# Patient Record
Sex: Female | Born: 1970 | Race: White | Hispanic: No | Marital: Married | Smoking: Current every day smoker
Health system: Southern US, Community
[De-identification: ages and names within clinical notes are randomized; demographics above are authoritative.]

## PROBLEM LIST (undated history)

## (undated) DIAGNOSIS — E039 Hypothyroidism, unspecified: Secondary | ICD-10-CM

## (undated) DIAGNOSIS — E8809 Other disorders of plasma-protein metabolism, not elsewhere classified: Secondary | ICD-10-CM

## (undated) DIAGNOSIS — T8859XA Other complications of anesthesia, initial encounter: Secondary | ICD-10-CM

## (undated) DIAGNOSIS — T4145XA Adverse effect of unspecified anesthetic, initial encounter: Secondary | ICD-10-CM

## (undated) DIAGNOSIS — Z8489 Family history of other specified conditions: Secondary | ICD-10-CM

## (undated) HISTORY — PX: TONSILLECTOMY: SUR1361

## (undated) HISTORY — PX: BREAST ENHANCEMENT SURGERY: SHX7

## (undated) HISTORY — DX: Hypothyroidism, unspecified: E03.9

---

## 2008-09-06 ENCOUNTER — Emergency Department: Payer: Self-pay | Admitting: Emergency Medicine

## 2009-08-27 ENCOUNTER — Emergency Department: Payer: Self-pay | Admitting: Emergency Medicine

## 2010-05-06 ENCOUNTER — Ambulatory Visit: Payer: Self-pay | Admitting: Obstetrics and Gynecology

## 2010-05-13 ENCOUNTER — Ambulatory Visit: Payer: Self-pay | Admitting: Obstetrics and Gynecology

## 2010-06-07 ENCOUNTER — Ambulatory Visit: Payer: Self-pay | Admitting: Emergency Medicine

## 2010-11-28 ENCOUNTER — Emergency Department: Payer: Self-pay | Admitting: Emergency Medicine

## 2015-11-10 ENCOUNTER — Encounter: Payer: Self-pay | Admitting: Emergency Medicine

## 2015-11-10 ENCOUNTER — Emergency Department
Admission: EM | Admit: 2015-11-10 | Discharge: 2015-11-10 | Disposition: A | Discharge status: Home / Self Care | Payer: 59 | Attending: Emergency Medicine | Admitting: Emergency Medicine

## 2015-11-10 DIAGNOSIS — J069 Acute upper respiratory infection, unspecified: Secondary | ICD-10-CM

## 2015-11-10 DIAGNOSIS — F17210 Nicotine dependence, cigarettes, uncomplicated: Secondary | ICD-10-CM | POA: Insufficient documentation

## 2015-11-10 DIAGNOSIS — M436 Torticollis: Secondary | ICD-10-CM | POA: Diagnosis not present

## 2015-11-10 DIAGNOSIS — R05 Cough: Secondary | ICD-10-CM | POA: Diagnosis present

## 2015-11-10 MED ORDER — METHOCARBAMOL 750 MG PO TABS: 750.0000 mg | ORAL_TABLET | Freq: Four times a day (QID) | ORAL | Status: DC

## 2015-11-10 MED ORDER — FEXOFENADINE-PSEUDOEPHED ER 60-120 MG PO TB12: 1.0000 | ORAL_TABLET | Freq: Two times a day (BID) | ORAL | Status: DC

## 2015-11-10 MED ORDER — NAPROXEN 500 MG PO TABS: 500.0000 mg | ORAL_TABLET | Freq: Two times a day (BID) | ORAL | Status: DC

## 2015-11-10 NOTE — Discharge Instructions (Signed)
Acute Torticollis Torticollis is a condition in which the muscles of the neck tighten (contract) abnormally, causing the neck to twist and the head to move into an unnatural position. Torticollis that develops suddenly is called acute torticollis. If torticollis becomes chronic and is left untreated, the face and neck can become deformed. CAUSES This condition may be caused by:  Sleeping in an awkward position (common).  Extending or twisting the neck muscles beyond their normal position.  Infection. In some cases, the cause may not be known. SYMPTOMS Symptoms of this condition include:  An unnatural position of the head.  Neck pain.  A limited ability to move the neck.  Twisting of the neck to one side. DIAGNOSIS This condition is diagnosed with a physical exam. You may also have imaging tests, such as an X-ray, CT scan, or MRI. TREATMENT Treatment for this condition involves trying to relax the neck muscles. It may include:  Medicines or shots.  Physical therapy.  Surgery. This may be done in severe cases. HOME CARE INSTRUCTIONS  Take medicines only as directed by your health care provider.  Do stretching exercises and massage your neck as directed by your health care provider.  Keep all follow-up visits as directed by your health care provider. This is important. SEEK MEDICAL CARE IF:  You develop a fever. SEEK IMMEDIATE MEDICAL CARE IF:  You develop difficulty breathing.  You develop noisy breathing (stridor).  You start drooling.  You have trouble swallowing or have pain with swallowing.  You develop numbness or weakness in your hands or feet.  You have changes in your speech, understanding, or vision.  Your pain gets worse.   This information is not intended to replace advice given to you by your health care provider. Make sure you discuss any questions you have with your health care provider.   Document Released: 06/16/2000 Document Revised:  11/03/2014 Document Reviewed: 06/15/2014 Elsevier Interactive Patient Education 2016 [employer]  Upper Respiratory Infection, Adult Most upper respiratory infections (URIs) are a viral infection of the air passages leading to the lungs. A URI affects the nose, throat, and upper air passages. The most common type of URI is nasopharyngitis and is typically referred to as "the common cold." URIs run their course and usually go away on their own. Most of the time, a URI does not require medical attention, but sometimes a bacterial infection in the upper airways can follow a viral infection. This is called a secondary infection. Sinus and middle ear infections are common types of secondary upper respiratory infections. Bacterial pneumonia can also complicate a URI. A URI can worsen asthma and chronic obstructive pulmonary disease (COPD). Sometimes, these complications can require emergency medical care and may be life threatening.  CAUSES Almost all URIs are caused by viruses. A virus is a type of germ and can spread from one person to another.  RISKS FACTORS You may be at risk for a URI if:   You smoke.   You have chronic heart or lung disease.  You have a weakened defense (immune) system.   You are very young or very old.   You have nasal allergies or asthma.  You work in crowded or poorly ventilated areas.  You work in health care facilities or schools. SIGNS AND SYMPTOMS  Symptoms typically develop 2-3 days after you come in contact with a cold virus. Most viral URIs last 7-10 days. However, viral URIs from the influenza virus (flu virus) can last 14-18 days and are  typically more severe. Symptoms may include:   Runny or stuffy (congested) nose.   Sneezing.   Cough.   Sore throat.   Headache.   Fatigue.   Fever.   Loss of appetite.   Pain in your forehead, behind your eyes, and over your cheekbones (sinus pain).  Muscle aches.  DIAGNOSIS  Your health  care provider may diagnose a URI by:  Physical exam.  Tests to check that your symptoms are not due to another condition such as:  Strep throat.  Sinusitis.  Pneumonia.  Asthma. TREATMENT  A URI goes away on its own with time. It cannot be cured with medicines, but medicines may be prescribed or recommended to relieve symptoms. Medicines may help:  Reduce your fever.  Reduce your cough.  Relieve nasal congestion. HOME CARE INSTRUCTIONS   Take medicines only as directed by your health care provider.   Gargle warm saltwater or take cough drops to comfort your throat as directed by your health care provider.  Use a warm mist humidifier or inhale steam from a shower to increase air moisture. This may make it easier to breathe.  Drink enough fluid to keep your urine clear or pale yellow.   Eat soups and other clear broths and maintain good nutrition.   Rest as needed.   Return to work when your temperature has returned to normal or as your health care provider advises. You may need to stay home longer to avoid infecting others. You can also use a face mask and careful hand washing to prevent spread of the virus.  Increase the usage of your inhaler if you have asthma.   Do not use any tobacco products, including cigarettes, chewing tobacco, or electronic cigarettes. If you need help quitting, ask your health care provider. PREVENTION  The best way to protect yourself from getting a cold is to practice good hygiene.   Avoid oral or hand contact with people with cold symptoms.   Wash your hands often if contact occurs.  There is no clear evidence that vitamin C, vitamin E, echinacea, or exercise reduces the chance of developing a cold. However, it is always recommended to get plenty of rest, exercise, and practice good nutrition.  SEEK MEDICAL CARE IF:   You are getting worse rather than better.   Your symptoms are not controlled by medicine.   You have  chills.  You have worsening shortness of breath.  You have brown or red mucus.  You have yellow or brown nasal discharge.  You have pain in your face, especially when you bend forward.  You have a fever.  You have swollen neck glands.  You have pain while swallowing.  You have white areas in the back of your throat. SEEK IMMEDIATE MEDICAL CARE IF:   You have severe or persistent:  Headache.  Ear pain.  Sinus pain.  Chest pain.  You have chronic lung disease and any of the following:  Wheezing.  Prolonged cough.  Coughing up blood.  A change in your usual mucus.  You have a stiff neck.  You have changes in your:  Vision.  Hearing.  Thinking.  Mood. MAKE SURE YOU:   Understand these instructions.  Will watch your condition.  Will get help right away if you are not doing well or get worse.   This information is not intended to replace advice given to you by your health care provider. Make sure you discuss any questions you have with your health care  provider.   Document Released: 12/13/2000 Document Revised: 11/03/2014 Document Reviewed: 09/24/2013 Elsevier Interactive Patient Education [employer]

## 2015-11-10 NOTE — ED Notes (Signed)
Patient ambulatory to triage with steady gait, without difficulty or distress noted; pt reports last couple weeks having cold symptoms; c/o neck and shoulder pain with head movement; productive cough, sinus congestion, HA, no fever

## 2015-11-10 NOTE — ED Provider Notes (Signed)
[facility] Emergency Department Provider Note  ____________________________________________  Time seen: Approximately 7:21 AM  I have reviewed the triage vital signs and the nursing notes.   HISTORY  Chief Complaint Cough; Torticollis; and Nasal Congestion   HPI Crystal Reynolds is a 45 y.o. female, NAD, presents to the emergency department today with two week history of cough and nasal congestion. She states she has had a cough with sputum but does not know what color it is. She woke up two days ago with neck pain and stiffness. She denies a fall or strain of her neck. She denies fever at any point in her illness. She has only taken ibuprofen for the pain. She does not like to use allergy medicine because it makes her feel drowsy.  History reviewed. No pertinent past medical history.  There are no active problems to display for this patient.   Past Surgical History  Procedure Laterality Date  . Cesarean section    . Tonsillectomy      Current Outpatient Rx  Name  Route  Sig  Dispense  Refill  . fexofenadine-pseudoephedrine (ALLEGRA-D) 60-120 MG 12 hr tablet   Oral   Take 1 tablet by mouth 2 (two) times daily.   20 tablet   0   . methocarbamol (ROBAXIN-750) 750 MG tablet   Oral   Take 1 tablet (750 mg total) by mouth 4 (four) times daily.   20 tablet   0   . naproxen (NAPROSYN) 500 MG tablet   Oral   Take 1 tablet (500 mg total) by mouth 2 (two) times daily with a meal.   20 tablet   0     Allergies Review of patient's allergies indicates no known allergies.  No family history on file.  Social History Social History  Substance Use Topics  . Smoking status: Current Every Day Smoker -- 1.00 packs/day    Types: Cigarettes  . Smokeless tobacco: None  . Alcohol Use: No    Review of Systems Constitutional: No fever/chills Eyes: No visual changes. ENT: Positive sore throat. Cardiovascular: Denies chest pain. Respiratory: Denies  shortness of breath. Positive cough. Gastrointestinal: Positive nausea. No abdominal pain. No vomiting.  No diarrhea.  No constipation. Genitourinary: Negative for dysuria. Musculoskeletal:Positive for neck pain with movement. Negative for back pain. Skin: Negative for rash. Neurological: Negative for headaches, focal weakness or numbness.  ____________________________________________   PHYSICAL EXAM:  VITAL SIGNS: ED Triage Vitals  Enc Vitals Group     BP 11/10/15 0655 117/55 mmHg     Pulse Rate 11/10/15 0655 71     Resp 11/10/15 0655 20     Temp 11/10/15 0655 97.8 F (36.6 C)     Temp Source 11/10/15 0655 Oral     SpO2 11/10/15 0655 100 %     Weight 11/10/15 0655 142 lb (64.411 kg)     Height 11/10/15 0655  (1.702 m)     Head Cir --      Peak Flow --      Pain Score 11/10/15 0655 1     Pain Loc --      Pain Edu? --      Excl. in GC? --    Constitutional: Alert and oriented. Well appearing and in no acute distress. Eyes: Conjunctivae are normal. EOMI. Head: Atraumatic. Nose: No congestion/rhinnorhea. Mouth/Throat: Mucous membranes are moist.  Oropharynx non-erythematous. Neck: No stridor.  TTP along the trapezius. Hematological/Lymphatic/Immunilogical: No cervical lymphadenopathy. Cardiovascular: Normal rate, regular  rhythm. Grossly normal heart sounds.  Good peripheral circulation. Respiratory: Normal respiratory effort.  No retractions. Lungs CTAB. Gastrointestinal: Soft and nontender. No distention. No CVA tenderness.  Musculoskeletal: Full ROM of neck with mild pain. No nuchal rigidity. No lower extremity tenderness nor edema.  No joint effusions. Neurologic:  Normal speech and language. No gross focal neurologic deficits are appreciated. No gait instability.  Skin:  Skin is warm, dry and intact. No rash noted. Psychiatric: Mood and affect are normal. Speech and behavior are normal.  PROCEDURES  Procedure(s) performed: None  Critical Care performed:  No  ____________________________________________   INITIAL IMPRESSION / ASSESSMENT AND PLAN / ED COURSE  Pertinent labs & imaging results that were available during my care of the patient were reviewed by me and considered in my medical decision making (see chart for details).  Upper respiratory infection with torticollis. Discharged with naprosyn, robaxin, and allegra. Ad[facility] if symptoms worsen or continue. ____________________________________________   FINAL CLINICAL IMPRESSION(S) / ED DIAGNOSES  Final diagnoses:  Torticollis  Upper respiratory infection      NEW MEDICATIONS STARTED DURING THIS VISIT:  New Prescriptions   FEXOFENADINE-PSEUDOEPHEDRINE (ALLEGRA-D) 60-120 MG 12 HR TABLET    Take 1 tablet by mouth 2 (two) times daily.   METHOCARBAMOL (ROBAXIN-750) 750 MG TABLET    Take 1 tablet (750 mg total) by mouth 4 (four) times daily.   NAPROXEN (NAPROSYN) 500 MG TABLET    Take 1 tablet (500 mg total) by mouth 2 (two) times daily with a meal.     Note:  This document was prepared using Dragon voice recognition software and may include unintentional dictation errors.    Joni Reiningonald K Dwayn Moravek, PA-C 11/10/15 [id number]  Minna AntisKevin Paduchowski, MD 11/10/15 [phone]

## 2016-05-31 ENCOUNTER — Encounter: Payer: Self-pay | Admitting: Primary Care

## 2016-05-31 ENCOUNTER — Ambulatory Visit (INDEPENDENT_AMBULATORY_CARE_PROVIDER_SITE_OTHER)
Admission: RE | Admit: 2016-05-31 | Discharge: 2016-05-31 | Disposition: A | Discharge status: Home / Self Care | Payer: Commercial Managed Care - HMO | Source: Ambulatory Visit | Attending: Primary Care | Admitting: Primary Care

## 2016-05-31 ENCOUNTER — Ambulatory Visit (INDEPENDENT_AMBULATORY_CARE_PROVIDER_SITE_OTHER): Payer: Commercial Managed Care - HMO | Admitting: Primary Care

## 2016-05-31 VITALS — BP 114/74 | HR 76 | Temp 98.2°F | Ht 67.0 in | Wt 155.1 lb

## 2016-05-31 DIAGNOSIS — E039 Hypothyroidism, unspecified: Secondary | ICD-10-CM | POA: Diagnosis not present

## 2016-05-31 DIAGNOSIS — M25561 Pain in right knee: Secondary | ICD-10-CM

## 2016-05-31 DIAGNOSIS — F319 Bipolar disorder, unspecified: Secondary | ICD-10-CM

## 2016-05-31 DIAGNOSIS — G8929 Other chronic pain: Secondary | ICD-10-CM

## 2016-05-31 DIAGNOSIS — Z720 Tobacco use: Secondary | ICD-10-CM

## 2016-05-31 LAB — TSH: TSH: 1.64 u[IU]/mL (ref 0.35–4.50)

## 2016-05-31 LAB — BASIC METABOLIC PANEL
BUN: 16 mg/dL (ref 6–23)
CHLORIDE: 103 meq/L (ref 96–112)
CO2: 29 meq/L (ref 19–32)
CREATININE: 0.84 mg/dL (ref 0.40–1.20)
Calcium: 9.5 mg/dL (ref 8.4–10.5)
GFR: 77.62 mL/min (ref >60.00)
Glucose, Bld: 88 mg/dL (ref 70–99)
Potassium: 4.2 mEq/L (ref 3.5–5.1)
Sodium: 137 mEq/L (ref 135–145)

## 2016-05-31 NOTE — Assessment & Plan Note (Signed)
Since fall in April 2017. Obvious effusion today. Will check xray given weakness and pain. Will also have her see Sports Med for further treatment given chronicity. Would benefit from PT, may require MRI in the future.

## 2016-05-31 NOTE — Assessment & Plan Note (Signed)
No recent TSH since 2012. No medication use since 2012. Will recheck today as symptoms of weight gain, anxiety, fatigue could be contributed. TSH pending.

## 2016-05-31 NOTE — Assessment & Plan Note (Signed)
Symptoms of daily anxiety, some depression. Will check TSH today. Does not wish to be on medications, respect that. Will continue to monitor symptoms and work on tobacco cessation.

## 2016-05-31 NOTE — Progress Notes (Signed)
Subjective:    Patient ID: Crystal Reynolds, female    DOB: 1971/04/03, 45 y.o.   MRN: [id number]  HPI  Crystal Reynolds is a 45 year old female who presents today to establish care and discuss the problems mentioned below. Will obtain old records.  1) Tobacco Abuse: History of tobacco abuse since early 30's. She is smoking 1 PPD of cigarettes now. She has a family history of lung cancer in her mother. Over the last several months she's noticed shortness of breath and chest tightness, constant cough. She is ready to stop smoking as she is aware that it is harmful for her health, but will need assistance. She is very sensitive to medication side effects. She was previously managed on Chantix four years ago and had side effects of insomnia and terrible nightmares. She's not tried the nicotine patches or gum OTC. She uses smoking to help reduce anxiety and stress.  2) Thyroid Disorder: History of hypothyroidism since childhood. Was previously managed on Synthroid for years until 2000 after her 4th pregnancy when her thyroid corrected itself. Her last TSH was in 2012. She's experienced weight gain, anxiety, and is more sensitive to colder weather.  3) Bipolar Disorder: Diagnosed in early 20's. She was managed on medication up until her mid 7530's as she didn't like the way medication made her feel. She's used exercise and cigarettes for treatment up until April 2017 when she became unable to exercise. She continues to experience anxiety and panic attacks. She's also been frustrated with her weight gain since an accident in April 2017. She was previously managed on Wellbutrin, Prozac, Lithium. She denies SI/HI.  4) Knee Pain: Feel off of a ladder at work in April 2017. She fractured her left wrist, displaced her left shoulder, and bruised her right knee. Her right knee has been bothering her intermittently since the accident. She worse a brace to her right knee for a while. She's continued to experience swelling  to her right knee and hsa had several needle aspirations with return of swelling almost immediately. She's also noticed weakness and crepitus. Her symptoms prevent her from working out.    Review of Systems  Constitutional: Positive for fatigue. Negative for fever and unexpected weight change.  HENT: Negative for trouble swallowing.   Respiratory: Positive for cough, chest tightness and shortness of breath.   Cardiovascular: Negative for chest pain.  Musculoskeletal: Positive for arthralgias and joint swelling.  Skin: Negative for color change.  Neurological: Negative for dizziness and headaches.  Psychiatric/Behavioral: Negative for suicidal ideas. The patient is nervous/anxious.        Past Medical History:  Diagnosis Date  . Hypothyroid      Social History   Social History  . Marital status: Married    Spouse name: N/A  . Number of children: N/A  . Years of education: N/A   Occupational History  . Not on file.   Social History Main Topics  . Smoking status: Current Every Day Smoker    Packs/day: 1.00    Types: Cigarettes  . Smokeless tobacco: Not on file  . Alcohol use No  . Drug use: Unknown  . Sexual activity: Not on file   Other Topics Concern  . Not on file   Social History Narrative   Married.   4 children.   Works as a Futures traderhomemaker.       Past Surgical History:  Procedure Laterality Date  . CESAREAN SECTION    . TONSILLECTOMY  Family History  Problem Relation Age of Onset  . Lung cancer Mother   . Hypertension Father   . Hyperlipidemia Father     No Known Allergies  No current outpatient prescriptions on file prior to visit.   No current facility-administered medications on file prior to visit.     BP 114/74   Pulse 76   Temp 98.2 F (36.8 C) (Oral)   Ht 5\' 7"  (1.702 m)   Wt 155 lb 1.9 oz (70.4 kg)   SpO2 98%   BMI 24.30 kg/m    Objective:   Physical Exam  Constitutional: She appears well-nourished.  Neck: Neck supple.    Cardiovascular: Normal rate and regular rhythm.   Pulmonary/Chest: Effort normal and breath sounds normal. She has no wheezes.  Musculoskeletal:  Mild-moderate effusion to right knee, superficial. No erythema.  Skin: Skin is warm and dry.  Psychiatric: She has a normal mood and affect.          Assessment & Plan:

## 2016-05-31 NOTE — Progress Notes (Signed)
Pre visit review using our clinic review tool, if applicable. No additional management support is needed unless otherwise documented below in the visit note. 

## 2016-05-31 NOTE — Patient Instructions (Signed)
Complete lab work and xray prior to leaving today. I will notify you of your results once received.   Start the transdermal nicotine patch. Apply the patch to a non hairy site of the body and leave on for 24 hours. Rotate sites to prevent skin irritation.  Refrain from smoking when using nicotine patches.  It was a pleasure to meet you today! Please don't hesitate to call me with any questions. Welcome to [facility]!

## 2016-05-31 NOTE — Assessment & Plan Note (Signed)
Smoker intermittently for 10 years. Ready to quit. Discussed options. Side effects with Chantix and Wellbutrin in the past. Will have her start with patches and sugar free gum. Will consider therapy if no improvement. Strongly encouraged her to quit, especially given FH of lung cancer.

## 2016-06-01 ENCOUNTER — Other Ambulatory Visit: Payer: Self-pay | Admitting: Adult Health

## 2016-06-01 DIAGNOSIS — Z1231 Encounter for screening mammogram for malignant neoplasm of breast: Secondary | ICD-10-CM

## 2016-06-01 DIAGNOSIS — Z9882 Breast implant status: Secondary | ICD-10-CM

## 2016-06-02 ENCOUNTER — Other Ambulatory Visit: Payer: Self-pay | Admitting: Primary Care

## 2016-06-02 DIAGNOSIS — F411 Generalized anxiety disorder: Secondary | ICD-10-CM

## 2016-06-02 DIAGNOSIS — Z720 Tobacco use: Secondary | ICD-10-CM

## 2016-06-21 ENCOUNTER — Encounter: Payer: Self-pay | Admitting: Family Medicine

## 2016-06-21 ENCOUNTER — Ambulatory Visit (INDEPENDENT_AMBULATORY_CARE_PROVIDER_SITE_OTHER): Payer: Commercial Managed Care - HMO | Admitting: Family Medicine

## 2016-06-21 VITALS — BP 110/64 | HR 64 | Temp 98.4°F | Ht 67.0 in | Wt 157.5 lb

## 2016-06-21 DIAGNOSIS — M25561 Pain in right knee: Secondary | ICD-10-CM

## 2016-06-21 DIAGNOSIS — M25461 Effusion, right knee: Secondary | ICD-10-CM

## 2016-06-21 DIAGNOSIS — M2351 Chronic instability of knee, right knee: Secondary | ICD-10-CM | POA: Diagnosis not present

## 2016-06-21 DIAGNOSIS — M7041 Prepatellar bursitis, right knee: Secondary | ICD-10-CM

## 2016-06-21 MED ORDER — DIAZEPAM 5 MG PO TABS: ORAL_TABLET | ORAL | 0 refills | Status: DC

## 2016-06-21 NOTE — Patient Instructions (Signed)

## 2016-06-21 NOTE — Progress Notes (Signed)
Pre visit review using our clinic review tool, if applicable. No additional management support is needed unless otherwise documented below in the visit note. 

## 2016-06-21 NOTE — Progress Notes (Signed)
Dr. Karleen HampshireSpencer T. Isley Weisheit, MD, CAQ Sports Medicine Primary Care and Sports Medicine [address] [place], [address] Phone: [phone] Fax: [phone]  06/21/2016  Patient: Crystal LucyJudith M Cliburn, MRN: [id number], DOB: 1970/07/16, 45 y.o.  Primary Physician:  Morrie Sheldonlark,Katherine Kendal, NP   Chief Complaint  Patient presents with  . Knee Pain    Right-Had fall about 7 to 8 months ago   Subjective:   Crystal LucyJudith M Teale is a 45 y.o. very pleasant female patient who presents with the following:  The patient describes a history where up until 8 months ago she was extremely active, hiking, working out all the time, and kickboxing.  At that time she had an event and she fell and hurt her knee.  Since that time she has per her report to me been unable to resume her baseline activity level.  She is unable to go to the gym, she cannot kick box, she cannot do any the things that she wants to for her normally physical self where she usually works out more than 1 hour a day.  The pain will wax and wane.  She also has a large prepatellar bursitis.  This will fluctuate in size over time.  Sometimes this hurts, but other times it doesn't hurt at all.  Her knee globally feels unstable, and feels that it is going to give out.  She has multiple sensations where her knee is going to symptomatically give way and where she feels unsteady.  She has taken multiple over-the-counter medications including anti-inflammatories, Tylenol, and she is a well versed physical person and is done strengthening for her core, hips, and quadriceps without much success and still maintains instability in the knee.  Past Medical History, Surgical History, Social History, Family History, Problem List, Medications, and Allergies have been reviewed and updated if relevant.  Patient Active Problem List   Diagnosis Date Noted  . Chronic pain of right knee 05/31/2016  . Hypothyroidism 05/31/2016  . Bipolar disorder (HCC) 05/31/2016    . Tobacco abuse 05/31/2016    Past Medical History:  Diagnosis Date  . Hypothyroid     Past Surgical History:  Procedure Laterality Date  . CESAREAN SECTION    . TONSILLECTOMY      Social History   Social History  . Marital status: Married    Spouse name: N/A  . Number of children: N/A  . Years of education: N/A   Occupational History  . Not on file.   Social History Main Topics  . Smoking status: Current Every Day Smoker    Packs/day: 1.00    Types: Cigarettes  . Smokeless tobacco: Never Used  . Alcohol use No  . Drug use: Unknown  . Sexual activity: Not on file   Other Topics Concern  . Not on file   Social History Narrative   Married.   4 children.   Works as a Futures traderhomemaker.       Family History  Problem Relation Age of Onset  . Lung cancer Mother   . Hypertension Father   . Hyperlipidemia Father     No Known Allergies  Medication list reviewed and updated in full in [facility].  GEN: No fevers, chills. Nontoxic. Primarily MSK c/o today. MSK: Detailed in the HPI GI: tolerating PO intake without difficulty Neuro: No numbness, parasthesias, or tingling associated. Otherwise the pertinent positives of the ROS are noted above.   Objective:   BP 110/64   Pulse 64  Temp 98.4 F (36.9 C) (Oral)   Ht 5\' 7"  (1.702 m)   Wt 157 lb 8 oz (71.4 kg)   BMI 24.67 kg/m    GEN: WDWN, NAD, Non-toxic, Alert & Oriented x 3 HEENT: Atraumatic, Normocephalic.  Ears and Nose: No external deformity. EXTR: No clubbing/cyanosis/edema NEURO: Normal gait.  PSYCH: Normally interactive. Conversant. Not depressed or anxious appearing.  Calm demeanor.    Right knee: Moderate prepatellar bursitis.  This is not hot, warm, and easily compressible. Mild effusion. There is a large swelling in the posterior aspect of the patient's knee, consistent with popliteal cyst.  There is also some tenderness on the medial joint line greater than the lateral joint line.  MCL and  LCL are intact.  There is some increased give on Lachman on the right compared to left, but PCL does appear to be intact.  Bony anatomy is nontender.  Tibial plateau as well as proximal fibular nontender.  Radiology: Dg Knee Ap/lat W/sunrise Right  Result Date: 05/31/2016 CLINICAL DATA:  Chronic right knee pain since injury April 2017. Initial encounter. EXAM: RIGHT KNEE 3 VIEWS COMPARISON:  None. FINDINGS: No evidence of fracture, dislocation, or joint effusion. No degenerative joint narrowing. Nonspecific tiny ossific density in the posterior joint line on the lateral view. Infrapatellar soft tissue swelling. IMPRESSION: 1. Anterior soft tissue thickening which could reflect bursitis. 2. No acute osseous finding. Electronically Signed   By: Marnee SpringJonathon  Watts M.D.   On: 05/31/2016 15:01    Assessment and Plan:   Right knee pain, unspecified chronicity - Plan: MR KNEE RIGHT WO CONTRAST  Recurrent right knee instability - Plan: MR KNEE RIGHT WO CONTRAST  Effusion of right knee - Plan: MR KNEE RIGHT WO CONTRAST  Prepatellar bursitis of right knee  given the history of 8 months of instability and poor outcome for the patient, I would recommend structural integrity  Evaluation to ensure that the patient's ACL remains intact with MRI of the knee.  Continued functional instability is concerning and a very active and strong athletic 45 year old patient who previously was otherwise healthy and had to knee problems.  Baker cyst is prominent.  Multiple other internal derangement possible, further evaluation recommended.  It came to my attention that the initial may have occurred in the workplace, and the patient may pursue this with her employer.   Follow-up: No Follow-up on file.  Meds ordered this encounter  Medications  . diazepam (VALIUM) 5 MG tablet    Sig: 1 tab 30 minutes before MRI    Dispense:  1 tablet    Refill:  0   There are no discontinued medications. Orders Placed This Encounter    Procedures  . MR KNEE RIGHT WO CONTRAST    Signed,  Anari Evitt T. Taci Sterling, MD   Allergies as of 06/21/2016   No Known Allergies     Medication List       Accurate as of 06/21/16 11:59 PM. Always use your most recent med list.          diazepam 5 MG tablet Commonly known as:  VALIUM 1 tab 30 minutes before MRI

## 2016-07-06 ENCOUNTER — Ambulatory Visit
Admission: RE | Admit: 2016-07-06 | Discharge: 2016-07-06 | Disposition: A | Discharge status: Home / Self Care | Payer: Commercial Managed Care - HMO | Source: Ambulatory Visit | Attending: Adult Health | Admitting: Adult Health

## 2016-07-06 DIAGNOSIS — Z9882 Breast implant status: Secondary | ICD-10-CM

## 2016-07-06 DIAGNOSIS — Z1231 Encounter for screening mammogram for malignant neoplasm of breast: Secondary | ICD-10-CM

## 2016-07-12 ENCOUNTER — Ambulatory Visit (INDEPENDENT_AMBULATORY_CARE_PROVIDER_SITE_OTHER): Payer: 59 | Admitting: Psychology

## 2016-07-12 DIAGNOSIS — F3131 Bipolar disorder, current episode depressed, mild: Secondary | ICD-10-CM | POA: Diagnosis not present

## 2016-07-24 ENCOUNTER — Ambulatory Visit (INDEPENDENT_AMBULATORY_CARE_PROVIDER_SITE_OTHER): Payer: 59 | Admitting: Psychology

## 2016-07-24 DIAGNOSIS — F17200 Nicotine dependence, unspecified, uncomplicated: Secondary | ICD-10-CM

## 2016-07-24 DIAGNOSIS — F3131 Bipolar disorder, current episode depressed, mild: Secondary | ICD-10-CM | POA: Diagnosis not present

## 2016-07-31 ENCOUNTER — Ambulatory Visit (INDEPENDENT_AMBULATORY_CARE_PROVIDER_SITE_OTHER): Payer: 59 | Admitting: Psychology

## 2016-07-31 DIAGNOSIS — F17200 Nicotine dependence, unspecified, uncomplicated: Secondary | ICD-10-CM | POA: Diagnosis not present

## 2016-07-31 DIAGNOSIS — F3131 Bipolar disorder, current episode depressed, mild: Secondary | ICD-10-CM

## 2016-08-07 ENCOUNTER — Ambulatory Visit (INDEPENDENT_AMBULATORY_CARE_PROVIDER_SITE_OTHER): Payer: 59 | Admitting: Psychology

## 2016-08-07 DIAGNOSIS — F17200 Nicotine dependence, unspecified, uncomplicated: Secondary | ICD-10-CM

## 2016-08-07 DIAGNOSIS — F3131 Bipolar disorder, current episode depressed, mild: Secondary | ICD-10-CM

## 2016-08-14 ENCOUNTER — Ambulatory Visit (INDEPENDENT_AMBULATORY_CARE_PROVIDER_SITE_OTHER): Payer: 59 | Admitting: Psychology

## 2016-08-14 DIAGNOSIS — F17200 Nicotine dependence, unspecified, uncomplicated: Secondary | ICD-10-CM

## 2016-08-14 DIAGNOSIS — F3131 Bipolar disorder, current episode depressed, mild: Secondary | ICD-10-CM

## 2016-08-21 ENCOUNTER — Ambulatory Visit: Payer: 59 | Admitting: Psychology

## 2016-08-28 ENCOUNTER — Ambulatory Visit: Payer: Self-pay | Admitting: Psychology

## 2016-09-04 ENCOUNTER — Ambulatory Visit: Payer: Self-pay | Admitting: Psychology

## 2016-09-11 ENCOUNTER — Ambulatory Visit: Payer: 59 | Admitting: Psychology

## 2017-04-30 IMAGING — MG 2D DIGITAL SCREENING BILATERAL MAMMOGRAM WITH IMPLANTS, CAD AND
8 of 17 series · 8 of 33 positions shown · non-contrast
Comparison: None

CLINICAL DATA: Screening. Baseline examination.

EXAM:
2D DIGITAL SCREENING BILATERAL MAMMOGRAM WITH IMPLANTS, CAD AND
ADJUNCT TOMO
The patient has retroglandular implants. Standard and implant
displaced views were performed.

[L MLO]
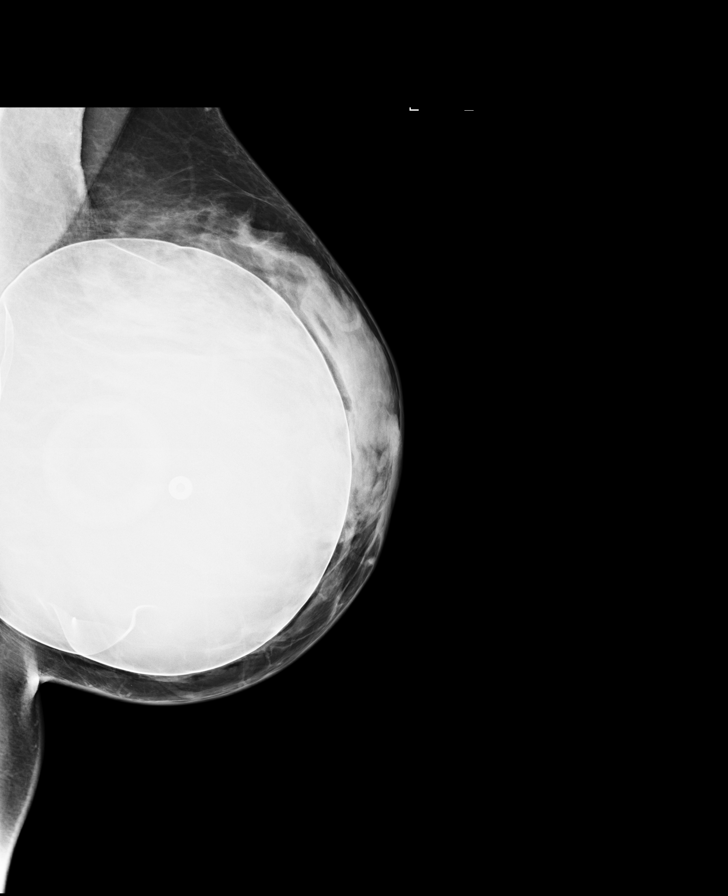

[R MLO (1 of 2)]
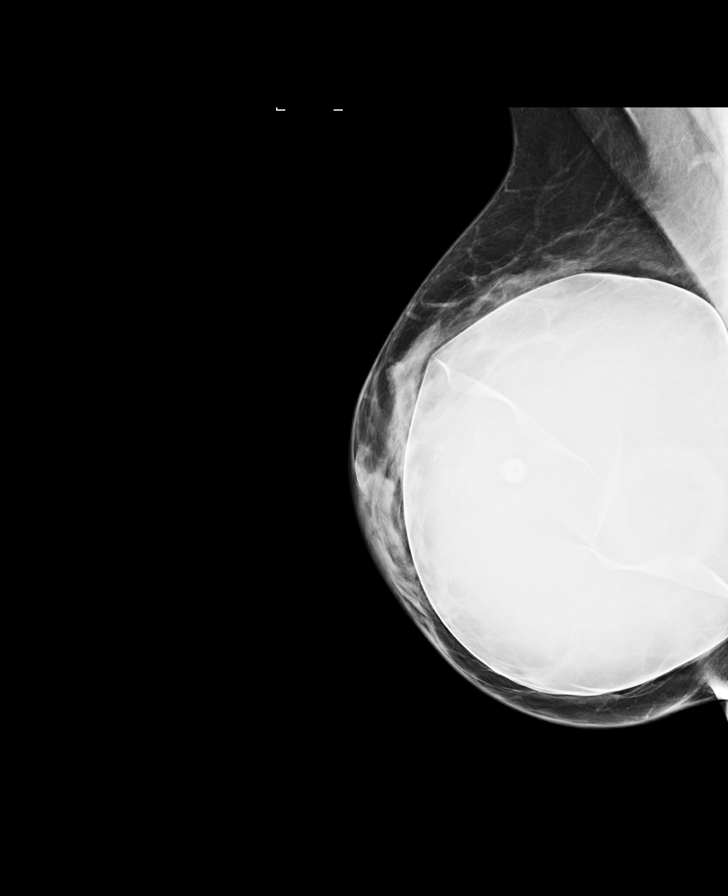

[R CC (1 of 2)]
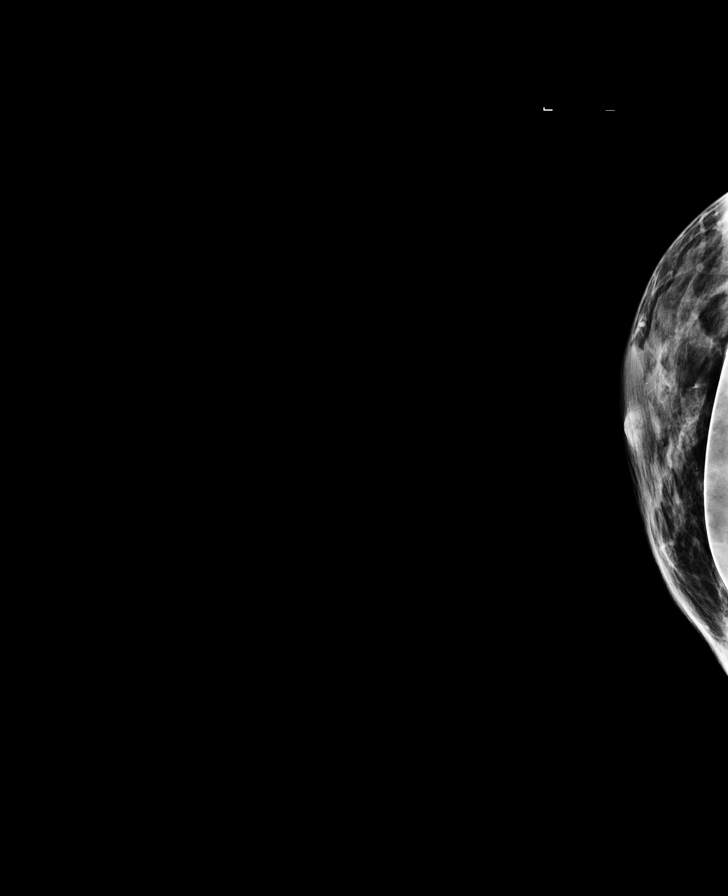

[R CC (2 of 2)]
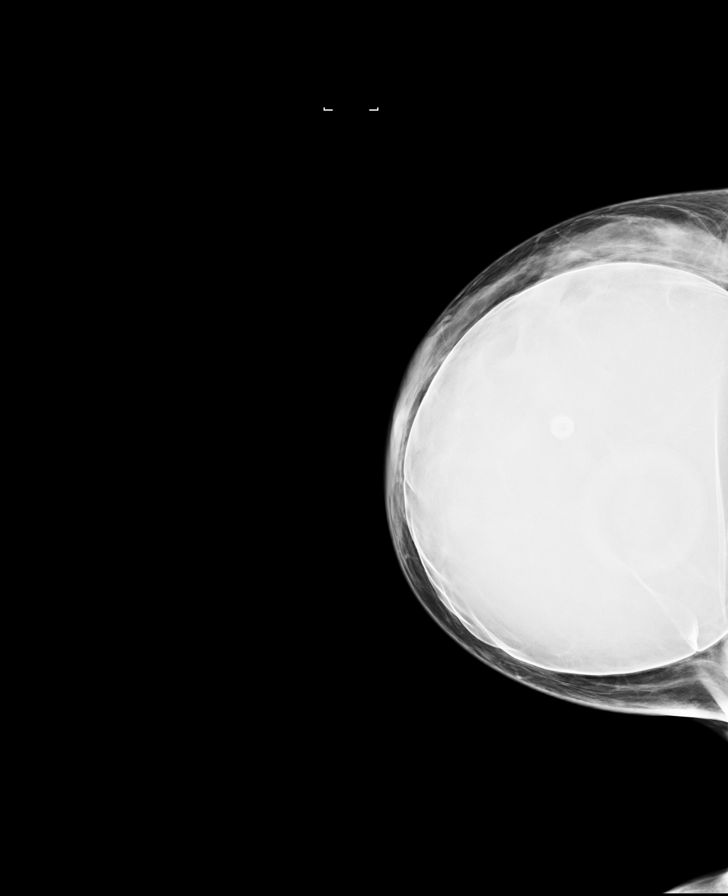

[L CC (1 of 2)]
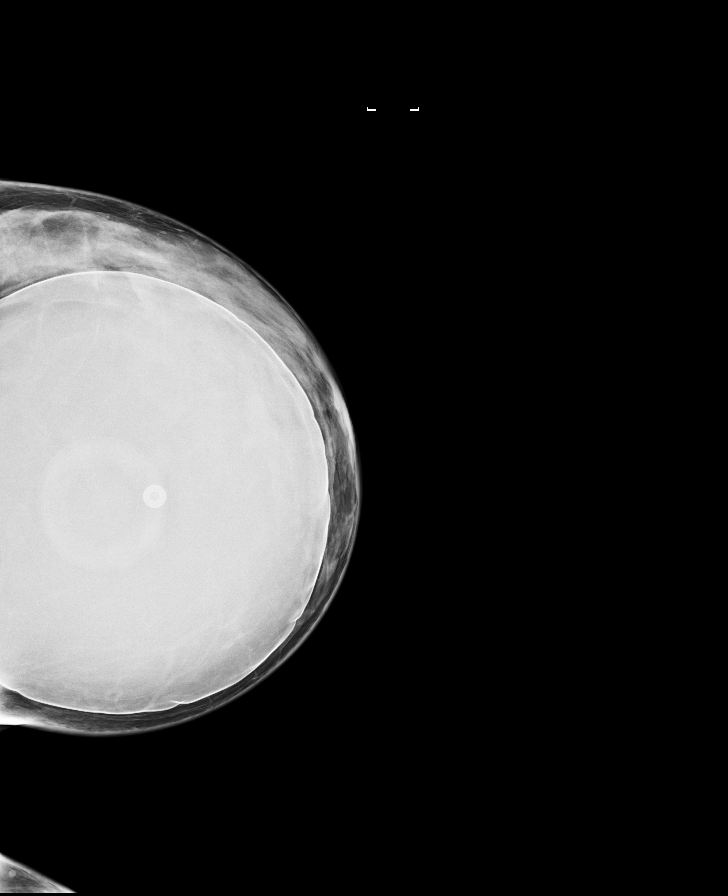

[R CC synth-2D]
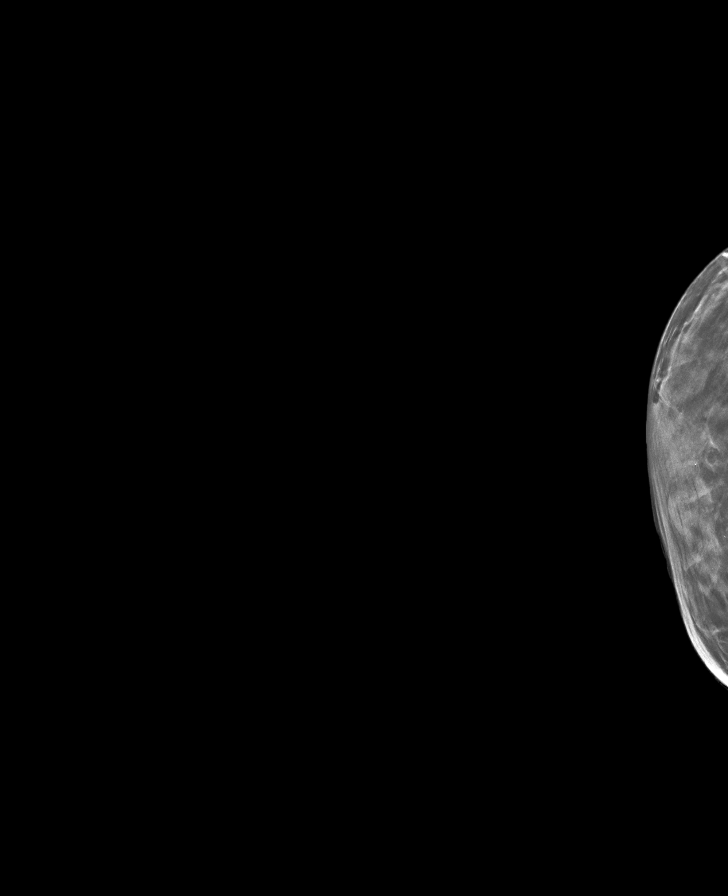

[R MLO (2 of 2)]
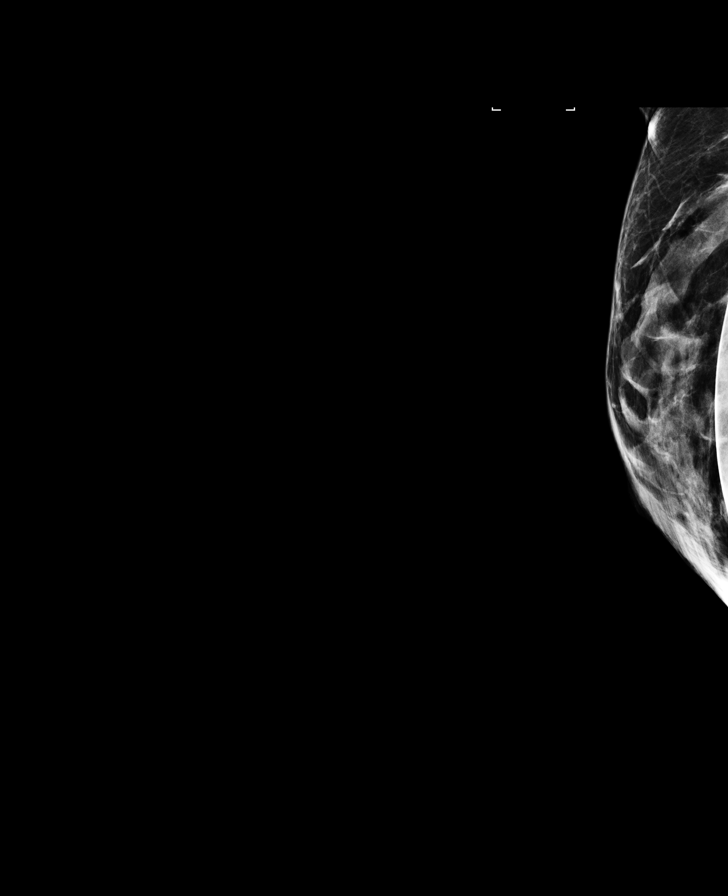

[L CC (2 of 2)]
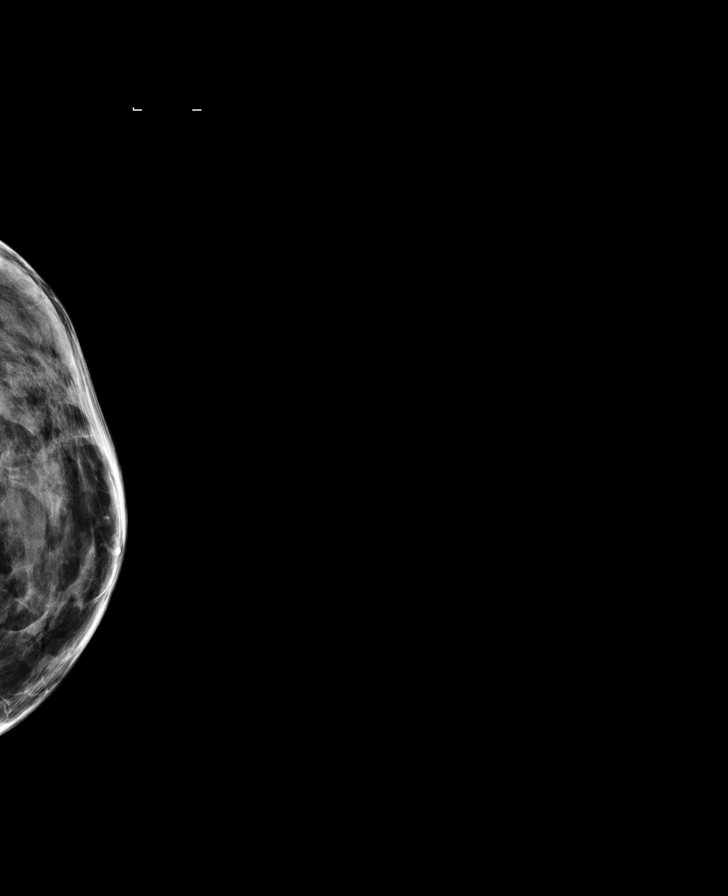

[8 of 33 positions shown; findings below may reference images not displayed]

ACR Breast Density Category c: The breast tissue is heterogeneously
dense, which may obscure small masses.
FINDINGS: There are no findings suspicious for malignancy. Images were
processed with CAD.
IMPRESSION: No mammographic evidence of malignancy. A result letter of this
screening mammogram will be mailed directly to the patient.

RECOMMENDATION:
Screening mammogram in one year. (Code:6D-2-EGR)

BI-RADS CATEGORY  1:  Negative.

## 2017-11-07 ENCOUNTER — Other Ambulatory Visit: Payer: Self-pay

## 2017-11-07 ENCOUNTER — Emergency Department
Admission: EM | Admit: 2017-11-07 | Discharge: 2017-11-07 | Disposition: A | Discharge status: Home / Self Care | Payer: 59 | Attending: Emergency Medicine | Admitting: Emergency Medicine

## 2017-11-07 ENCOUNTER — Encounter: Payer: Self-pay | Admitting: Emergency Medicine

## 2017-11-07 DIAGNOSIS — M7139 Other bursal cyst, multiple sites: Secondary | ICD-10-CM

## 2017-11-07 DIAGNOSIS — R2241 Localized swelling, mass and lump, right lower limb: Secondary | ICD-10-CM | POA: Diagnosis present

## 2017-11-07 DIAGNOSIS — E039 Hypothyroidism, unspecified: Secondary | ICD-10-CM | POA: Diagnosis not present

## 2017-11-07 DIAGNOSIS — M6749 Ganglion, multiple sites: Secondary | ICD-10-CM

## 2017-11-07 DIAGNOSIS — F17210 Nicotine dependence, cigarettes, uncomplicated: Secondary | ICD-10-CM | POA: Insufficient documentation

## 2017-11-07 DIAGNOSIS — M67461 Ganglion, right knee: Secondary | ICD-10-CM | POA: Insufficient documentation

## 2017-11-07 DIAGNOSIS — M6789 Other specified disorders of synovium and tendon, multiple sites: Secondary | ICD-10-CM

## 2017-11-07 MED ORDER — LIDOCAINE HCL (PF) 1 % IJ SOLN
5.0000 mL | Freq: Once | INTRAMUSCULAR | Status: AC
  Administered 2017-11-07: 5 mL via INTRADERMAL
  Filled 2017-11-07: qty 5

## 2017-11-07 MED ORDER — BUPIVACAINE HCL (PF) 0.5 % IJ SOLN
2.0000 mL | Freq: Once | INTRAMUSCULAR | Status: AC
  Administered 2017-11-07: 2 mL
  Filled 2017-11-07: qty 30

## 2017-11-07 MED ORDER — TRIAMCINOLONE ACETONIDE 40 MG/ML IJ SUSP
20.0000 mg | Freq: Once | INTRAMUSCULAR | Status: AC
  Administered 2017-11-07: 20 mg via INTRADERMAL
  Filled 2017-11-07: qty 0.5

## 2017-11-07 NOTE — ED Provider Notes (Signed)
[facility] Emergency Department Provider Note  ____________________________________________  Time seen: Approximately 2:34 PM  I have reviewed the triage vital signs and the nursing notes.   HISTORY  Chief Complaint Cyst   HPI Crystal Reynolds is a 47 y.o. female who presents to the emergency department for evaluation and treatment of a cyst to the right knee that developed about a year ago. Area continues to get larger and is preventing her from being able to perform her job duties as expected. She requests to have it drained. She denies pain.  Past Medical History:  Diagnosis Date  . Hypothyroid     Patient Active Problem List   Diagnosis Date Noted  . Chronic pain of right knee 05/31/2016  . Hypothyroidism 05/31/2016  . Bipolar disorder (HCC) 05/31/2016  . Tobacco abuse 05/31/2016    Past Surgical History:  Procedure Laterality Date  . CESAREAN SECTION    . TONSILLECTOMY      Prior to Admission medications   Medication Sig Start Date End Date Taking? Authorizing Provider  diazepam (VALIUM) 5 MG tablet 1 tab 30 minutes before MRI 06/21/16   Copland, Karleen Hampshire, MD    Allergies Patient has no known allergies.  Family History  Problem Relation Age of Onset  . Lung cancer Mother   . Hypertension Father   . Hyperlipidemia Father     Social History Social History   Tobacco Use  . Smoking status: Current Every Day Smoker    Packs/day: 1.00    Types: Cigarettes  . Smokeless tobacco: Never Used  Substance Use Topics  . Alcohol use: No  . Drug use: Never    Review of Systems  Constitutional: Negative for fever. Respiratory: Negative for cough or shortness of breath.  Musculoskeletal: Negative for myalgias Skin: Positive for lesion to the right knee Neurological: Negative for numbness or paresthesias. ____________________________________________   PHYSICAL EXAM:  VITAL SIGNS: ED Triage Vitals  Enc Vitals Group     BP 11/07/17  1241 115/67     Pulse Rate 11/07/17 1241 60     Resp 11/07/17 1241 14     Temp 11/07/17 1241 98.1 F (36.7 C)     Temp Source 11/07/17 1241 Oral     SpO2 11/07/17 1241 97 %     Weight 11/07/17 1242 128 lb (58.1 kg)     Height 11/07/17 1242  (1.702 m)     Head Circumference --      Peak Flow --      Pain Score 11/07/17 1306 0     Pain Loc --      Pain Edu? --      Excl. in GC? --      Constitutional: Well appearing. Eyes: Conjunctivae are clear without discharge or drainage. Nose: No rhinorrhea noted. Mouth/Throat: Airway is patent.  Neck: No stridor. Unrestricted range of motion observed. Lymphatic: n/a Cardiovascular: Capillary refill is <3 seconds.  Respiratory: Respirations are even and unlabored.. Musculoskeletal: Unrestricted range of motion observed. Neurologic: Awake, alert, and oriented x 4.  Skin: 3 cm cystic, fluctuant, mobile lesion over the lateral aspect of the knee  ____________________________________________   LABS (all labs ordered are listed, but only abnormal results are displayed)  Labs Reviewed  BODY FLUID CULTURE   ____________________________________________  EKG  Not indicated ____________________________________________  RADIOLOGY  Not indicated ____________________________________________   PROCEDURES  .Marland KitchenIncision and Drainage Date/Time: 11/07/2017 4:04 PM Performed by: Chinita Pester, FNP Authorized by: Chinita Pester, FNP  Consent:    Consent obtained:  Verbal   Consent given by:  Patient   Risks discussed:  Incomplete drainage and infection Location:    Type:  Ganglion cyst   Location:  Lower extremity   Lower extremity location:  Knee   Knee location:  R knee Pre-procedure details:    Skin preparation:  Betadine Anesthesia (see MAR for exact dosages):    Anesthesia method:  Local infiltration   Local anesthetic:  Lidocaine 1% w/o epi Procedure type:    Complexity:  Simple Procedure details:    Needle  aspiration: yes     Needle size:  18 G   Techniques: 20 mg of Kenalog and 2 mL's of bupivacaine 0.05% injected post drainage.   Drainage characteristics: straw.   Drainage amount:  Moderate   Packing materials:  None   ____________________________________________   INITIAL IMPRESSION / ASSESSMENT AND PLAN / ED COURSE  Crystal Reynolds is a 47 y.o. female who presents to the emergency department to request drainage of a cyst on her right knee.  Cyst was drained under sterile protocol and fluid was sent to lab for analysis.  Patient was advised to follow-up with her primary care provider for symptoms of concern or return to the emergency department if she is unable to schedule appointment. Medications  triamcinolone acetonide (KENALOG-40) injection 20 mg (20 mg Intradermal Given 11/07/17 1543)  bupivacaine (MARCAINE) 0.5 % injection 2 mL (2 mLs Infiltration Given 11/07/17 1543)  lidocaine (PF) (XYLOCAINE) 1 % injection 5 mL (5 mLs Intradermal Given 11/07/17 1543)       Pertinent labs & imaging results that were available during my care of the patient were reviewed by me and considered in my medical decision making (see chart for details). ____________________________________________   FINAL CLINICAL IMPRESSION(S) / ED DIAGNOSES  Final diagnoses:  Ganglion and cyst of synovium, tendon and bursa    ED Discharge Orders    None       Note:  This document was prepared using Dragon voice recognition software and may include unintentional dictation errors.    Chinita Pester, FNP 11/07/17 1607    Emily Filbert, MD 11/08/17 1114

## 2017-11-07 NOTE — ED Notes (Signed)
Discharge information by this RN entered in error at 1336.

## 2017-11-07 NOTE — ED Notes (Signed)
Pt sitting at bedside, no complaints.

## 2017-11-07 NOTE — ED Triage Notes (Signed)
Pt comes into the ED via POV c/o cyst or growth on the right knee.  Patient states it has been growing for a while and she doesn't know if it need to be drained or just removed.  Patient in NAD at this time and is not limited in mobility due to the growth.

## 2017-11-11 LAB — BODY FLUID CULTURE: CULTURE: NO GROWTH

## 2018-01-23 ENCOUNTER — Encounter: Payer: Self-pay | Admitting: Family Medicine

## 2018-01-23 ENCOUNTER — Ambulatory Visit: Payer: 59 | Admitting: Family Medicine

## 2018-01-23 VITALS — BP 100/60 | HR 62 | Temp 98.3°F | Ht 67.0 in | Wt 134.5 lb

## 2018-01-23 DIAGNOSIS — M7051 Other bursitis of knee, right knee: Secondary | ICD-10-CM | POA: Diagnosis not present

## 2018-01-23 DIAGNOSIS — M7041 Prepatellar bursitis, right knee: Secondary | ICD-10-CM

## 2018-01-23 DIAGNOSIS — F319 Bipolar disorder, unspecified: Secondary | ICD-10-CM | POA: Diagnosis not present

## 2018-01-23 MED ORDER — METHYLPREDNISOLONE ACETATE 40 MG/ML IJ SUSP
20.0000 mg | Freq: Once | INTRAMUSCULAR | Status: AC
  Administered 2018-01-23: 20 mg via INTRA_ARTICULAR

## 2018-01-23 MED ORDER — DOXYCYCLINE HYCLATE 100 MG PO TABS: 100.0000 mg | ORAL_TABLET | Freq: Two times a day (BID) | ORAL | 0 refills | Status: AC

## 2018-01-23 NOTE — Progress Notes (Signed)
Dr. Karleen HampshireSpencer T. Emmeline Winebarger, MD, CAQ Sports Medicine Primary Care and Sports Medicine [address] [place], [address] Phone: [phone] Fax: [phone]  01/23/2018  Patient: Crystal LucyJudith M Reynolds, MRN: [id number], DOB: [birth year], 47 y.o.  Primary Physician:  Doreene Nestlark, Katherine K, NP   Chief Complaint  Patient presents with  . right knee bursa   Subjective:   Crystal LucyJudith M [place] is a 47 y.o. very pleasant female patient who presents with the following:  R large prepatellar bursa. 2 years.  She is a very pleasant lady who is quite active.  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  She is very fit and very active.  She uses this as a way to help her maintain her fitness and healthy life, but also has a weight to maintain a healthy mental state, and this is worked quite well for her.  She developed a prepatellar bursitis approximately 2 years ago, this is wax and wane.  She did have this drained once previously.  She does have some mechanical catching occasionally in this knee as well.  7 cc fluid Aspirate and injection, 1/2 and 1/2 R  Past Medical History, Surgical History, Social History, Family History, Problem List, Medications, and Allergies have been reviewed and updated if relevant.  Patient Active Problem List   Diagnosis Date Noted  . Chronic pain of right knee 05/31/2016  . Hypothyroidism 05/31/2016  . Bipolar disorder (HCC) 05/31/2016  . Tobacco abuse 05/31/2016    Past Medical History:  Diagnosis Date  . Hypothyroid     Past Surgical History:  Procedure Laterality Date  . CESAREAN SECTION    . TONSILLECTOMY      Social History   Socioeconomic History  . Marital status: Married    Spouse name: Not on file  . Number of children: Not on file  . Years of education: Not on file  . Highest education level: Not on file  Occupational History  . Not on file  Social Needs  . Financial resource  strain: Not on file  . Food insecurity:    Worry: Not on file    Inability: Not on file  . Transportation needs:    Medical: Not on file    Non-medical: Not on file  Tobacco Use  . Smoking status: Current Every Day Smoker    Packs/day: 1.00    Types: Cigarettes  . Smokeless tobacco: Never Used  Substance and Sexual Activity  . Alcohol use: No  . Drug use: Never  . Sexual activity: Not on file  Lifestyle  . Physical activity:    Days per week: Not on file    Minutes per session: Not on file  . Stress: Not on file  Relationships  . Social connections:    Talks on phone: Not on file    Gets together: Not on file    Attends religious service: Not on file    Active member of club or organization: Not on file    Attends meetings of clubs or organizations: Not on file    Relationship status: Not on file  . Intimate partner violence:    Fear of current or ex partner: Not on file    Emotionally abused: Not on file    Physically abused: Not on file    Forced sexual activity: Not on file  Other Topics Concern  . Not on file  Social History Narrative  Married.   4 children.   Works as a Futures trader.    Family History  Problem Relation Age of Onset  . Lung cancer Mother   . Hypertension Father   . Hyperlipidemia Father     No Known Allergies  Medication list reviewed and updated in full in [facility].  GEN: No fevers, chills. Nontoxic. Primarily MSK c/o today. MSK: Detailed in the HPI GI: tolerating PO intake without difficulty Neuro: No numbness, parasthesias, or tingling associated. Otherwise the pertinent positives of the ROS are noted above.   Objective:   BP 100/60   Pulse 62   Temp 98.3 F (36.8 C) (Oral)   Ht 5\' 7"  (1.702 m)   Wt 134 lb 8 oz (61 kg)   BMI 21.07 kg/m    GEN: WDWN, NAD, Non-toxic, Alert & Oriented x 3 HEENT: Atraumatic, Normocephalic.  Ears and Nose: No external deformity. EXTR: No clubbing/cyanosis/edema NEURO: Normal gait.    PSYCH: Normally interactive. Conversant. Not depressed or anxious appearing.  Calm demeanor.   Knee:  R Gait: Normal heel toe pattern ROM: 0-140 Effusion: large prepatellar bursitis Echymosis or edema: none Patellar tendon NT Painful PLICA: neg Patellar grind: negative Medial and lateral patellar facet loading: negative medial and lateral joint lines:NT Mcmurray's neg Flexion-pinch neg Varus and valgus stress: stable Lachman: neg Ant and Post drawer: neg Hip abduction, IR, ER: WNL Hip flexion str: 5/5 Hip abd: 5/5 Quad: 5/5 VMO atrophy:No Hamstring concentric and eccentric: 5/5   Radiology: No results found.  Assessment and Plan:   Prepatellar bursitis of right knee  Bursitis of right knee, unspecified bursa - Plan: methylPREDNISolone acetate (DEPO-MEDROL) injection 20 mg  Bipolar affective disorder, remission status unspecified (HCC)  >25 minutes spent in face to face time with patient, >50% spent in counselling or coordination of care   We talked about various options.  She has had this for 2 years, which increases her risk of recurrence quite a bit.  We reviewed possible surgical bursectomy, and she is not really open to consider this at this time.  She was interested in a possible aspiration and injection, but I did review that this does carry some significant risk, most notably the risk for infection.  This is a potentially catastrophic complication.  Ultimately, the patient did want to pursue attempted aspiration and injection followed by compression, and I think that that is in reasonable idea with informed consent about possible risk.  I am going to place her on some prophylactic antibiotics post aspiration.  She also does have a diagnosis of bipolar disorder, she does not currently take any medication.  This is a low-dose of steroids, but there is always a risk of mania.  Knee Aspiration and Injection, R pretallar bursa Patient verbally consented; risks,  benefits, and alternatives explained including possible infection. Patient prepped with Chloraprep. Ethyl chloride for anesthesia. Under sterile conditions, 18 gauge needle used to directly aspirate 7 cc of serosanguinous fluid. Then 1/2 cc of Lidocaine 1% and 1/2 mL of Depo-Medrol 40 mg injected. Tolerated well, decreased pain, no complications. An ace bandage was used for compression.  Follow-up: if needed.  Meds ordered this encounter  Medications  . doxycycline (VIBRA-TABS) 100 MG tablet    Sig: Take 1 tablet (100 mg total) by mouth 2 (two) times daily for 7 days.    Dispense:  14 tablet    Refill:  0  . methylPREDNISolone acetate (DEPO-MEDROL) injection 20 mg   Signed,  Chestine Belknap T. Jadelin Eng,  MD   Allergies as of 01/23/2018   No Known Allergies     Medication List        Accurate as of 01/23/18 11:59 PM. Always use your most recent med list.          doxycycline 100 MG tablet Commonly known as:  VIBRA-TABS Take 1 tablet (100 mg total) by mouth 2 (two) times daily for 7 days.   Turmeric 500 MG Caps Take by mouth 2 (two) times daily.

## 2018-01-28 ENCOUNTER — Encounter: Payer: Self-pay | Admitting: Family Medicine

## 2018-02-14 ENCOUNTER — Ambulatory Visit: Payer: 59 | Admitting: Family Medicine

## 2018-02-14 ENCOUNTER — Encounter: Payer: Self-pay | Admitting: Family Medicine

## 2018-02-14 VITALS — BP 92/60 | HR 57 | Temp 98.3°F | Ht 67.0 in | Wt 136.0 lb

## 2018-02-14 DIAGNOSIS — M7041 Prepatellar bursitis, right knee: Secondary | ICD-10-CM

## 2018-02-14 NOTE — Patient Instructions (Signed)
REFERRALS TO SPECIALISTS, SPECIAL TESTS (MRI, CT, ULTRASOUNDS)  MARION or  Anastasiya will help you. ASK CHECK-IN FOR HELP.  Specialist appointment times vary a great deal, based on their schedule / openings. -- Some specialists have very long wait times. (Example. Dermatology)    

## 2018-02-14 NOTE — Progress Notes (Signed)
Dr. Karleen HampshireSpencer T. Micheil Klaus, MD, CAQ Sports Medicine Primary [facility] Sports Medicine [address] [place], [id number] Phone: [phone] Fax: [phone]  02/14/2018  Patient: Crystal Reynolds Knoebel, MRN: [id number], DOB: [birth year], 47 y.o.  Primary Physician:  Doreene Nestlark, Katherine K, NP   Chief Complaint  Patient presents with  . Referral    Wants referral to have cyst removed from right knee   Subjective:   Crystal Reynolds Bhola is a 47 y.o. very pleasant female patient who presents with the following:  Large prepatellar bursititis -I remember Darel HongJudy well.  I saw her on January 23, 2018, at that point we talked a lot about her prepatellar bursitis that is been present for about 2 years.  Had a request I did re-aspirate this to see if we might make it go away, but I did counsel her that the likelihood of this was reasonably low, and she did have a recurrence.  At this point she wanted to come in to talk further about possibly having a bursectomy.  Past Medical History, Surgical History, Social History, Family History, Problem List, Medications, and Allergies have been reviewed and updated if relevant.  Patient Active Problem List   Diagnosis Date Noted  . Chronic pain of right knee 05/31/2016  . Hypothyroidism 05/31/2016  . Bipolar disorder (HCC) 05/31/2016  . Tobacco abuse 05/31/2016    Past Medical History:  Diagnosis Date  . Hypothyroid     Past Surgical History:  Procedure Laterality Date  . CESAREAN SECTION    . TONSILLECTOMY      Social History   Socioeconomic History  . Marital status: Married    Spouse name: Not on file  . Number of children: Not on file  . Years of education: Not on file  . Highest education level: Not on file  Occupational History  . Not on file  Social Needs  . Financial resource strain: Not on file  . Food insecurity:    Worry: Not on file    Inability: Not on file  . Transportation needs:    Medical: Not on file    Non-medical: Not on file    Tobacco Use  . Smoking status: Current Every Day Smoker    Packs/day: 1.00    Types: Cigarettes  . Smokeless tobacco: Never Used  Substance and Sexual Activity  . Alcohol use: No  . Drug use: Never  . Sexual activity: Not on file  Lifestyle  . Physical activity:    Days per week: Not on file    Minutes per session: Not on file  . Stress: Not on file  Relationships  . Social connections:    Talks on phone: Not on file    Gets together: Not on file    Attends religious service: Not on file    Active member of club or organization: Not on file    Attends meetings of clubs or organizations: Not on file    Relationship status: Not on file  . Intimate partner violence:    Fear of current or ex partner: Not on file    Emotionally abused: Not on file    Physically abused: Not on file    Forced sexual activity: Not on file  Other Topics Concern  . Not on file  Social History Narrative   Married.   4 children.   Works as a Futures traderhomemaker.    Family History  Problem Relation Age of Onset  . Lung cancer Mother   .  Hypertension Father   . Hyperlipidemia Father     No Known Allergies  Medication list reviewed and updated in full in [facility].   GEN: No acute illnesses, no fevers, chills. GI: No n/v/d, eating normally Pulm: No SOB Interactive and getting along well at home.  Otherwise, ROS is as per the HPI.  Objective:   BP 92/60   Pulse (!) 57   Temp 98.3 F (36.8 C) (Oral)   Ht 5\' 7"  (1.702 Reynolds)   Wt 136 lb (61.7 kg)   BMI 21.30 kg/Reynolds   GEN: WDWN, NAD, Non-toxic, A & O x 3 HEENT: Atraumatic, Normocephalic. Neck supple. No masses, No LAD. Ears and Nose: No external deformity. CV: RRR, No Reynolds/G/R. No JVD. No thrill. No extra heart sounds. PULM: CTA B, no wheezes, crackles, rhonchi. No retractions. No resp. distress. No accessory muscle use. EXTR: No c/c/e NEURO Normal gait.  PSYCH: Normally interactive. Conversant. Not depressed or anxious appearing.  Calm  demeanor.   Large R sided prepatellar bursa, taught  Laboratory and Imaging Data:  Assessment and Plan:   Prepatellar bursitis of right knee - Plan: Ambulatory referral to Orthopedic Surgery  She is interested in talking with surgery about potential bursectomy, and I think this is entirely reasonable given that this is limiting her quality of life.  She works out for 5 hours a day for her employment, and also this is of great benefit to her physically mentally.  Follow-up: No follow-ups on file.  Orders Placed This Encounter  Procedures  . Ambulatory referral to Orthopedic Surgery    Signed,  Karleen HampshireSpencer T. Anwen Cannedy, MD   Allergies as of 02/14/2018   No Known Allergies     Medication List        Accurate as of 02/14/18  1:56 PM. Always use your most recent med list.          Turmeric 500 MG Caps Take by mouth 2 (two) times daily.

## 2018-02-22 ENCOUNTER — Other Ambulatory Visit: Payer: Self-pay | Admitting: Orthopedic Surgery

## 2018-02-26 ENCOUNTER — Encounter
Admission: RE | Admit: 2018-02-26 | Discharge: 2018-02-26 | Disposition: A | Discharge status: Home / Self Care | Payer: 59 | Source: Ambulatory Visit | Attending: Orthopedic Surgery | Admitting: Orthopedic Surgery

## 2018-02-26 ENCOUNTER — Other Ambulatory Visit: Payer: Self-pay

## 2018-02-26 HISTORY — DX: Adverse effect of unspecified anesthetic, initial encounter: T41.45XA

## 2018-02-26 HISTORY — DX: Family history of other specified conditions: Z84.89

## 2018-02-26 HISTORY — DX: Other disorders of plasma-protein metabolism, not elsewhere classified: E88.09

## 2018-02-26 HISTORY — DX: Other complications of anesthesia, initial encounter: T88.59XA

## 2018-02-26 NOTE — Patient Instructions (Signed)
Your procedure is scheduled on: 03-05-18 TUESDAY Report to Same Day Surgery 2nd floor medical mall [facility](Medical Mall Entrance-take elevator on left to 2nd floor.  Check in with surgery information desk.) To find out your arrival time please call [phone] between 1PM - 3PM on 03-01-18 FRIDAY  Remember: Instructions that are not followed completely may result in serious medical risk, up to and including death, or upon the discretion of your surgeon and anesthesiologist your surgery may need to be rescheduled.    _x___ 1. Do not eat food after midnight the night before your procedure. NO GUM OR CANDY AFTER MIDNIGHT.  You may drink clear liquids up to 2 hours before you are scheduled to arrive at the hospital for your procedure.  Do not drink clear liquids within 2 hours of your scheduled arrival to the hospital.  Clear liquids include  --Water or Apple juice without pulp  --Clear carbohydrate beverage such as ClearFast or Gatorade  --Black Coffee or Clear Tea (No milk, no creamers, do not add anything to the coffee or Tea   ____Ensure clear carbohydrate drink on the way to the hospital for bariatric patients  ____Ensure clear carbohydrate drink 3 hours before surgery for Dr Rutherford NailByrnett's patients if physician instructed.   No gum chewing or hard candies.     __x__ 2. No Alcohol for 24 hours before or after surgery.   __x__3. No Smoking or e-cigarettes for 24 prior to surgery.  Do not use any chewable tobacco products for at least 6 hour prior to surgery   ____  4. Bring all medications with you on the day of surgery if instructed.    __x__ 5. Notify your doctor if there is any change in your medical condition     (cold, fever, infections).    x___6. On the morning of surgery brush your teeth with toothpaste and water.  You may rinse your mouth with mouth wash if you wish.  Do not swallow any toothpaste or mouthwash.   Do not wear jewelry, make-up, hairpins, clips or nail polish.  Do not wear  lotions, powders, or perfumes. You may wear deodorant.  Do not shave 48 hours prior to surgery. Men may shave face and neck.  Do not bring valuables to the hospital.    [facility] is not [facility] for any belongings or valuables.               Contacts, dentures or bridgework may not be worn into surgery.  Leave your suitcase in the car. After surgery it may be brought to your room.  For patients admitted to the hospital, discharge time is determined by your treatment team.  _  Patients discharged the day of surgery will not be allowed to drive home.  You will need someone to drive you home and stay with you the night of your procedure.    Please read over the following fact sheets that you were given:   [facility] Preparing for [facility]   ____ Take anti-hypertensive listed below, cardiac, seizure, asthma, anti-reflux and psychiatric medicines. These include:  1. NONE  2.  3.  4.  5.  6.  ____Fleets enema or Magnesium Citrate as directed.   _x___ Use CHG Soap or sage wipes as directed on instruction sheet   ____ Use inhalers on the day of surgery and bring to hospital day of surgery  ____ Stop Metformin and Janumet 2 days prior to surgery.    ____ Take 1/2 of usual  insulin dose the night before surgery and none on the morning surgery.   ____ Follow recommendations from Cardiologist, Pulmonologist or PCP regarding stopping Aspirin, Coumadin, Plavix ,Eliquis, Effient, or Pradaxa, and Pletal.  X____Stop Anti-inflammatories such as Advil, Aleve, Ibuprofen, Motrin, Naproxen, Naprosyn, Goodies powders or aspirin products NOW-OK to take Tylenol    ____ Stop supplements until after surgery.     ____ Bring C-Pap to the hospital.

## 2018-02-27 ENCOUNTER — Encounter
Admission: RE | Admit: 2018-02-27 | Discharge: 2018-02-27 | Disposition: A | Discharge status: Home / Self Care | Payer: 59 | Source: Ambulatory Visit | Attending: Orthopedic Surgery | Admitting: Orthopedic Surgery

## 2018-02-27 DIAGNOSIS — Z01812 Encounter for preprocedural laboratory examination: Secondary | ICD-10-CM | POA: Insufficient documentation

## 2018-02-27 LAB — BASIC METABOLIC PANEL
ANION GAP: 13 (ref 5–15)
BUN: 16 mg/dL (ref 6–20)
CHLORIDE: 100 mmol/L (ref 98–111)
CO2: 25 mmol/L (ref 22–32)
Calcium: 9.4 mg/dL (ref 8.9–10.3)
Creatinine, Ser: 0.88 mg/dL (ref 0.44–1.00)
GFR calc Af Amer: >60 mL/min (ref >60)
GFR calc non Af Amer: >60 mL/min (ref >60)
GLUCOSE: 143 mg/dL — AB (ref 70–99)
POTASSIUM: 3.4 mmol/L — AB (ref 3.5–5.1)
Sodium: 138 mmol/L (ref 135–145)

## 2018-02-27 LAB — CBC WITH DIFFERENTIAL/PLATELET
BASOS ABS: 0.1 10*3/uL (ref 0–0.1)
Basophils Relative: 1 %
EOS PCT: 2 %
Eosinophils Absolute: 0.2 10*3/uL (ref 0–0.7)
HEMATOCRIT: 41.8 % (ref 35.0–47.0)
Hemoglobin: 14.7 g/dL (ref 12.0–16.0)
LYMPHS ABS: 3.3 10*3/uL (ref 1.0–3.6)
LYMPHS PCT: 32 %
MCH: 32.8 pg (ref 26.0–34.0)
MCHC: 35.1 g/dL (ref 32.0–36.0)
MCV: 93.7 fL (ref 80.0–100.0)
Monocytes Absolute: 0.6 10*3/uL (ref 0.2–0.9)
Monocytes Relative: 5 %
NEUTROS ABS: 6.2 10*3/uL (ref 1.4–6.5)
Neutrophils Relative %: 60 %
PLATELETS: 222 10*3/uL (ref 150–440)
RBC: 4.46 MIL/uL (ref 3.80–5.20)
RDW: 12.6 % (ref 11.5–14.5)
WBC: 10.4 10*3/uL (ref 3.6–11.0)

## 2018-02-27 LAB — PROTIME-INR
INR: 0.86
Prothrombin Time: 11.6 seconds (ref 11.4–15.2)

## 2018-02-27 LAB — APTT: aPTT: 31 seconds (ref 24–36)

## 2018-03-04 MED ORDER — CEFAZOLIN SODIUM-DEXTROSE 2-4 GM/100ML-% IV SOLN
2.0000 g | INTRAVENOUS | Status: AC
  Administered 2018-03-05: 2 g via INTRAVENOUS

## 2018-03-05 ENCOUNTER — Ambulatory Visit
Admission: RE | Admit: 2018-03-05 | Discharge: 2018-03-05 | Disposition: A | Discharge status: Home / Self Care | Payer: No Typology Code available for payment source | Source: Ambulatory Visit | Attending: Orthopedic Surgery | Admitting: Orthopedic Surgery

## 2018-03-05 ENCOUNTER — Other Ambulatory Visit: Payer: Self-pay

## 2018-03-05 ENCOUNTER — Encounter
Admission: RE | Disposition: A | Discharge status: Home / Self Care | Payer: Self-pay | Source: Ambulatory Visit | Attending: Orthopedic Surgery

## 2018-03-05 ENCOUNTER — Ambulatory Visit: Payer: No Typology Code available for payment source | Admitting: Certified Registered Nurse Anesthetist

## 2018-03-05 ENCOUNTER — Encounter: Payer: Self-pay | Admitting: Anesthesiology

## 2018-03-05 DIAGNOSIS — M67461 Ganglion, right knee: Secondary | ICD-10-CM | POA: Insufficient documentation

## 2018-03-05 DIAGNOSIS — F17210 Nicotine dependence, cigarettes, uncomplicated: Secondary | ICD-10-CM | POA: Diagnosis not present

## 2018-03-05 DIAGNOSIS — M7041 Prepatellar bursitis, right knee: Secondary | ICD-10-CM | POA: Diagnosis present

## 2018-03-05 HISTORY — PX: KNEE BURSECTOMY: SHX5882

## 2018-03-05 LAB — POCT PREGNANCY, URINE: PREG TEST UR: NEGATIVE

## 2018-03-05 SURGERY — BURSECTOMY, KNEE
Anesthesia: General | Site: Knee | Laterality: Right | Wound class: Clean

## 2018-03-05 MED ORDER — ONDANSETRON HCL 4 MG PO TABS: 4.0000 mg | ORAL_TABLET | Freq: Three times a day (TID) | ORAL | 0 refills | Status: AC | PRN

## 2018-03-05 MED ORDER — PROPOFOL 10 MG/ML IV BOLUS
INTRAVENOUS | Status: DC | PRN
  Administered 2018-03-05: 150 mg via INTRAVENOUS

## 2018-03-05 MED ORDER — FENTANYL CITRATE (PF) 100 MCG/2ML IJ SOLN
INTRAMUSCULAR | Status: DC | PRN
  Administered 2018-03-05 (×2): 50 ug via INTRAVENOUS

## 2018-03-05 MED ORDER — LACTATED RINGERS IV SOLN
INTRAVENOUS | Status: DC
  Administered 2018-03-05: 10:00:00 via INTRAVENOUS

## 2018-03-05 MED ORDER — LIDOCAINE HCL (CARDIAC) PF 100 MG/5ML IV SOSY
PREFILLED_SYRINGE | INTRAVENOUS | Status: DC | PRN
  Administered 2018-03-05: 100 mg via INTRAVENOUS

## 2018-03-05 MED ORDER — NEOMYCIN-POLYMYXIN B GU 40-200000 IR SOLN
Status: AC
  Filled 2018-03-05: qty 20

## 2018-03-05 MED ORDER — HYDROCODONE-ACETAMINOPHEN 5-325 MG PO TABS: 1.0000 | ORAL_TABLET | ORAL | 0 refills | Status: AC | PRN

## 2018-03-05 MED ORDER — SODIUM CHLORIDE 0.9 % IR SOLN
Status: DC | PRN
  Administered 2018-03-05: 400 mL

## 2018-03-05 MED ORDER — CHLORHEXIDINE GLUCONATE CLOTH 2 % EX PADS: 6.0000 | MEDICATED_PAD | Freq: Once | CUTANEOUS | Status: DC

## 2018-03-05 MED ORDER — ACETAMINOPHEN 10 MG/ML IV SOLN
INTRAVENOUS | Status: DC | PRN
  Administered 2018-03-05: 1000 mg via INTRAVENOUS

## 2018-03-05 MED ORDER — PROPOFOL 10 MG/ML IV BOLUS
INTRAVENOUS | Status: AC
  Filled 2018-03-05: qty 20

## 2018-03-05 MED ORDER — DEXAMETHASONE SODIUM PHOSPHATE 10 MG/ML IJ SOLN
INTRAMUSCULAR | Status: DC | PRN
  Administered 2018-03-05: 10 mg via INTRAVENOUS

## 2018-03-05 MED ORDER — CEFAZOLIN SODIUM-DEXTROSE 2-4 GM/100ML-% IV SOLN
INTRAVENOUS | Status: AC
  Filled 2018-03-05: qty 100

## 2018-03-05 MED ORDER — ASPIRIN EC 325 MG PO TBEC: 325.0000 mg | DELAYED_RELEASE_TABLET | Freq: Every day | ORAL | 0 refills | Status: AC

## 2018-03-05 MED ORDER — MIDAZOLAM HCL 2 MG/2ML IJ SOLN
INTRAMUSCULAR | Status: AC
  Filled 2018-03-05: qty 2

## 2018-03-05 MED ORDER — FAMOTIDINE 20 MG PO TABS
20.0000 mg | ORAL_TABLET | Freq: Once | ORAL | Status: AC
  Administered 2018-03-05: 20 mg via ORAL

## 2018-03-05 MED ORDER — ONDANSETRON HCL 4 MG/2ML IJ SOLN
INTRAMUSCULAR | Status: DC | PRN
  Administered 2018-03-05: 4 mg via INTRAVENOUS

## 2018-03-05 MED ORDER — MIDAZOLAM HCL 2 MG/2ML IJ SOLN
INTRAMUSCULAR | Status: DC | PRN
  Administered 2018-03-05: 2 mg via INTRAVENOUS

## 2018-03-05 MED ORDER — ACETAMINOPHEN 10 MG/ML IV SOLN
INTRAVENOUS | Status: AC
  Filled 2018-03-05: qty 100

## 2018-03-05 MED ORDER — LIDOCAINE HCL (PF) 2 % IJ SOLN
INTRAMUSCULAR | Status: AC
  Filled 2018-03-05: qty 10

## 2018-03-05 MED ORDER — ONDANSETRON HCL 4 MG/2ML IJ SOLN: 4.0000 mg | Freq: Once | INTRAMUSCULAR | Status: DC | PRN

## 2018-03-05 MED ORDER — EPHEDRINE SULFATE 50 MG/ML IJ SOLN
INTRAMUSCULAR | Status: DC | PRN
  Administered 2018-03-05: 5 mg via INTRAVENOUS
  Administered 2018-03-05: 10 mg via INTRAVENOUS
  Administered 2018-03-05: 15 mg via INTRAVENOUS

## 2018-03-05 MED ORDER — GLYCOPYRROLATE 0.2 MG/ML IJ SOLN
INTRAMUSCULAR | Status: DC | PRN
  Administered 2018-03-05: 0.2 mg via INTRAVENOUS

## 2018-03-05 MED ORDER — HYDROCODONE-ACETAMINOPHEN 5-325 MG PO TABS: 1.0000 | ORAL_TABLET | ORAL | Status: DC | PRN

## 2018-03-05 MED ORDER — SEVOFLURANE IN SOLN
RESPIRATORY_TRACT | Status: AC
  Filled 2018-03-05: qty 250

## 2018-03-05 MED ORDER — HYDROCODONE-ACETAMINOPHEN 5-325 MG PO TABS
ORAL_TABLET | ORAL | Status: AC
  Administered 2018-03-05: 1
  Filled 2018-03-05: qty 1

## 2018-03-05 MED ORDER — FENTANYL CITRATE (PF) 100 MCG/2ML IJ SOLN
INTRAMUSCULAR | Status: AC
  Filled 2018-03-05: qty 2

## 2018-03-05 MED ORDER — FENTANYL CITRATE (PF) 100 MCG/2ML IJ SOLN: 25.0000 ug | INTRAMUSCULAR | Status: DC | PRN

## 2018-03-05 MED ORDER — FAMOTIDINE 20 MG PO TABS
ORAL_TABLET | ORAL | Status: AC
  Filled 2018-03-05: qty 1

## 2018-03-05 SURGICAL SUPPLY — 42 items
BANDAGE ELASTIC 4 LF NS (GAUZE/BANDAGES/DRESSINGS) ×3 IMPLANT
BLADE SURG SZ10 CARB STEEL (BLADE) ×3 IMPLANT
BNDG COHESIVE 4X5 TAN STRL (GAUZE/BANDAGES/DRESSINGS) ×3 IMPLANT
BNDG ESMARK 4X12 TAN STRL LF (GAUZE/BANDAGES/DRESSINGS) ×3 IMPLANT
CANISTER SUCT 1200ML W/VALVE (MISCELLANEOUS) ×3 IMPLANT
CLOSURE WOUND 1/2 X4 (GAUZE/BANDAGES/DRESSINGS) ×1
COOLER POLAR GLACIER W/PUMP (MISCELLANEOUS) IMPLANT
CUFF TOURN 24 STER (MISCELLANEOUS) ×3 IMPLANT
CUFF TOURN 30 STER DUAL PORT (MISCELLANEOUS) IMPLANT
DRAPE U-SHAPE 47X51 STRL (DRAPES) ×3 IMPLANT
DURAPREP 26ML APPLICATOR (WOUND CARE) ×9 IMPLANT
ELECT REM PT RETURN 9FT ADLT (ELECTROSURGICAL) ×3
ELECTRODE REM PT RTRN 9FT ADLT (ELECTROSURGICAL) ×1 IMPLANT
GAUZE SPONGE 4X4 12PLY STRL (GAUZE/BANDAGES/DRESSINGS) ×3 IMPLANT
GLOVE BIOGEL PI IND STRL 9 (GLOVE) ×1 IMPLANT
GLOVE BIOGEL PI INDICATOR 9 (GLOVE) ×2
GLOVE SURG 9.0 ORTHO LTXF (GLOVE) ×6 IMPLANT
GOWN STRL REUS TWL 2XL XL LVL4 (GOWN DISPOSABLE) ×3 IMPLANT
GOWN STRL REUS W/ TWL LRG LVL3 (GOWN DISPOSABLE) ×1 IMPLANT
GOWN STRL REUS W/TWL LRG LVL3 (GOWN DISPOSABLE) ×2
IMMBOLIZER KNEE 19 BLUE UNIV (SOFTGOODS) ×3 IMPLANT
KIT TURNOVER KIT A (KITS) ×3 IMPLANT
NDL SAFETY ECLIPSE 18X1.5 (NEEDLE) ×1 IMPLANT
NEEDLE HYPO 18GX1.5 SHARP (NEEDLE) ×2
PACK EXTREMITY ARMC (MISCELLANEOUS) ×3 IMPLANT
PAD WRAPON POLAR KNEE (MISCELLANEOUS) IMPLANT
PADDING CAST 4IN STRL (MISCELLANEOUS) ×2
PADDING CAST BLEND 4X4 STRL (MISCELLANEOUS) ×1 IMPLANT
STAPLER SKIN PROX 35W (STAPLE) ×3 IMPLANT
STOCKINETTE M/LG 89821 (MISCELLANEOUS) ×3 IMPLANT
STRIP CLOSURE SKIN 1/2X4 (GAUZE/BANDAGES/DRESSINGS) ×2 IMPLANT
SUT ETHILON 4-0 (SUTURE) ×2
SUT ETHILON 4-0 FS2 18XMFL BLK (SUTURE) ×1
SUT VIC AB 0 CT1 36 (SUTURE) ×3 IMPLANT
SUT VIC AB 2-0 CT1 (SUTURE) ×3 IMPLANT
SUT VICRYL 1-0 27IN AB (SUTURE)
SUT VICRYL 1-0 27IN ABS (SUTURE)
SUTURE ETHLN 4-0 FS2 18XMF BLK (SUTURE) ×1 IMPLANT
SUTURE VICRYL 1-0 27IN AB (SUTURE) IMPLANT
SYR 10ML 18GX1 1/2 (NEEDLE) ×3 IMPLANT
SYR 5ML LL (SYRINGE) ×3 IMPLANT
WRAPON POLAR PAD KNEE (MISCELLANEOUS)

## 2018-03-05 NOTE — OR Nursing (Signed)
Dr. Martha Clan in to speak w/pt Crystal Reynolds

## 2018-03-05 NOTE — Anesthesia Procedure Notes (Signed)
Procedure Name: LMA Insertion Date/Time: 03/05/2018 12:53 PM Performed by: Ginger Carne, CRNA Pre-anesthesia Checklist: Patient identified, Emergency Drugs available, Suction available, Patient being monitored and Timeout performed Patient Re-evaluated:Patient Re-evaluated prior to induction Oxygen Delivery Method: Circle system utilized Preoxygenation: Pre-oxygenation with 100% oxygen Induction Type: IV induction LMA: LMA inserted LMA Size: 4.0 Tube type: Oral Number of attempts: 1 Placement Confirmation: positive ETCO2 and breath sounds checked- equal and bilateral Tube secured with: Tape Dental Injury: Teeth and Oropharynx as per pre-operative assessment

## 2018-03-05 NOTE — Transfer of Care (Signed)
Immediate Anesthesia Transfer of Care Note  Patient: Crystal Reynolds  Procedure(s) Performed: KNEE BURSECTOMY (Right Knee)  Patient Location: PACU  Anesthesia Type:General  Level of Consciousness: awake, alert  and oriented  Airway & Oxygen Therapy: Patient Spontanous Breathing and Patient connected to face mask oxygen  Post-op Assessment: Report given to RN and Post -op Vital signs reviewed and stable  Post vital signs: Reviewed and stable  Last Vitals:  Vitals Value Taken Time  BP 152/68 03/05/2018  2:01 PM  Temp 36.9 C 03/05/2018  2:01 PM  Pulse 58 03/05/2018  2:04 PM  Resp 19 03/05/2018  2:04 PM  SpO2 100 % 03/05/2018  2:04 PM  Vitals shown include unvalidated device data.  Last Pain:  Vitals:   03/05/18 1401  TempSrc:   PainSc: Asleep         Complications: No apparent anesthesia complications

## 2018-03-05 NOTE — Op Note (Signed)
03/05/2018  2:00 PM  PATIENT:  Crystal Reynolds    PRE-OPERATIVE DIAGNOSIS:  PREPATELLAR FOCAL SWELLING OF RIGHT KNEE  POST-OPERATIVE DIAGNOSIS:  Ganglion cyst of right knee  PROCEDURE:  Right knee cyst excision  SURGEON:  Juanell Fairly, MD  ANESTHESIA:   General with LMA  EBL: 2 ML  IVF:   TOURNIQUET: 36 minutes.  PREOPERATIVE INDICATIONS:  [name] is a  47 y.o. female with a diagnosis of PREPATELLAR FOCAL SWELLING OF RIGHT KNEE who failed non-operative treatment and elected for surgical excision.  The large focal swelling is affecting her ability to fully flex her right knee and kneel.    I discussed the risks and benefits of surgery. The risks include but are not limited to infection, bleeding requiring blood transfusion, nerve or blood vessel injury, joint stiffness or loss of motion, persistent pain, weakness or instability, malunion, nonunion and hardware failure and the need for further surgery. Medical risks include but are not limited to DVT and pulmonary embolism, myocardial infarction, stroke, pneumonia, respiratory failure and death. Patient understood these risks and wished to proceed.   OPERATIVE FINDINGS: Right knee cystic mass, likely ganglion.  OPERATIVE PROCEDURE: Patient was in the operative area with her husband at the bedside.  I marked the right knee cording to the hospitals correct site of surgery protocol.  I performed a preop H&P.  She was then brought to the operating room where she was placed supine on the operative table.  She underwent general anesthesia with an LMA.  A tourniquet was applied to the right upper thigh.  She was prepped and draped in a sterile fashion.  Timeout was performed to verify the patient's name, date of birth, medical record number, correct site of surgery and correct procedure to be performed.  Was also used to confirm the patient received antibiotics all appropriate instruments were in the room.  In attendance were in  agreement the case began.  An Esmarch was used to exsanguinate the right lower extremity.  Tourniquet was inflated 250 mmHg.  It was inflated for a total of 36 minutes.  A midline vertical incision was made.  The cystic structure was just below the skin.  It was carefully dissected free of all skin adhesions.  The mass was sized in its entirety.  It had the appearance and consistency of a ganglion cyst.  The excised tissue was sent to pathology for further evaluation.  A photograph of the cyst was taken.  It measured almost 4 cm in length and approximately 2-3 cm in width.  The mass was excised all eating vessels were cauterized.  The wound was copiously irrigated.  The knee joint was not entered during this case.  The mass was completely in the prepatellar area of the right knee.  The skin was approximated with 4-0 nylon in interrupted fashion.  Steri-Strips were applied along with a.dry sterile dressing.  Patient had a knee immobilizer applied.  She was brought to the PACU in stable condition.  I was scrubbed and present for the entire case and all sharp and instrument counts were correct at the conclusion of the case.

## 2018-03-05 NOTE — Discharge Instructions (Signed)

## 2018-03-05 NOTE — Anesthesia Preprocedure Evaluation (Addendum)
Anesthesia Evaluation  Patient identified by MRN, date of birth, ID band Patient awake    Reviewed: Allergy & Precautions, NPO status , Patient's Chart, lab work & pertinent test results  History of Anesthesia Complications (+) AWARENESS UNDER ANESTHESIA, PSEUDOCHOLINESTERASE DEFICIENCY, Family history of anesthesia reaction and history of anesthetic complications  Airway Mallampati: III  TM Distance: >3 FB     Dental  (+) Caps, Chipped   Pulmonary Current Smoker,    Pulmonary exam normal        Cardiovascular negative cardio ROS Normal cardiovascular exam     Neuro/Psych PSYCHIATRIC DISORDERS Bipolar Disorder    GI/Hepatic negative GI ROS, Neg liver ROS,   Endo/Other  Hypothyroidism   Renal/GU negative Renal ROS  negative genitourinary   Musculoskeletal   Abdominal Normal abdominal exam  (+)   Peds negative pediatric ROS (+)  Hematology   Anesthesia Other Findings Past Medical History: No date: Complication of anesthesia     Comment:  PT HAS PSEUDOCHOLINESTERASE DEFICIENCY-HARD TIME WAKING               UP-WOKE UP DURING TONSILLECTOMY(AGE12) AND COULD HEAR,               SMELL AND FEEL BUT WAS UNABLE TO SPEAK No date: Family history of adverse reaction to anesthesia     Comment:  MOM, SISTER, MATERNAL AUNT-HARD TIME WAKING UP-UNCLE HAD              ANESTHESIA AND WENT INTO COMA AND DIED 2 MONTHS LATER No date: Hypothyroid No date: Pseudocholinesterase deficiency  Reproductive/Obstetrics                            Anesthesia Physical Anesthesia Plan  ASA: II  Anesthesia Plan: General   Post-op Pain Management:    Induction: Intravenous  PONV Risk Score and Plan:   Airway Management Planned: LMA  Additional Equipment:   Intra-op Plan:   Post-operative Plan: Extubation in OR  Informed Consent: I have reviewed the patients History and Physical, chart, labs and discussed the  procedure including the risks, benefits and alternatives for the proposed anesthesia with the patient or authorized representative who has indicated his/her understanding and acceptance.   Dental advisory given  Plan Discussed with: CRNA and Surgeon  Anesthesia Plan Comments: (Patient agreed to LMA general and avoidance of anectine .)        Anesthesia Quick Evaluation

## 2018-03-05 NOTE — Anesthesia Post-op Follow-up Note (Signed)
Anesthesia QCDR form completed.        

## 2018-03-05 NOTE — H&P (Signed)
PREOPERATIVE H&P  Chief Complaint: CHRONIC PREPATELLAR BURSITIS OF RIGHT KNEE  HPI: Crystal Reynolds is a 47 y.o. female who presents for preoperative history and physical with a diagnosis of CHRONIC PREPATELLAR BURSITIS OF RIGHT KNEE. The large size of the bursa is affecting her ability to fully flex her knee and therefore activities of daily living and at work.  She has failed non-operative management including corticosteroid injection by another doctor.  She has elected for surgical excision.   Past Medical History:  Diagnosis Date  . Complication of anesthesia    PT HAS PSEUDOCHOLINESTERASE DEFICIENCY-HARD TIME WAKING UP-WOKE UP DURING TONSILLECTOMY(AGE12) AND COULD HEAR, SMELL AND FEEL BUT WAS UNABLE TO SPEAK  . Family history of adverse reaction to anesthesia    MOM, SISTER, MATERNAL AUNT-HARD TIME WAKING UP-UNCLE HAD ANESTHESIA AND WENT INTO COMA AND DIED 2 MONTHS LATER  . Hypothyroid   . Pseudocholinesterase deficiency    Past Surgical History:  Procedure Laterality Date  . BREAST ENHANCEMENT SURGERY    . CESAREAN SECTION    . TONSILLECTOMY     Social History   Socioeconomic History  . Marital status: Married    Spouse name: Not on file  . Number of children: Not on file  . Years of education: Not on file  . Highest education level: Not on file  Occupational History  . Not on file  Social Needs  . Financial resource strain: Not on file  . Food insecurity:    Worry: Not on file    Inability: Not on file  . Transportation needs:    Medical: Not on file    Non-medical: Not on file  Tobacco Use  . Smoking status: Current Every Day Smoker    Packs/day: 1.00    Years: 7.00    Pack years: 7.00    Types: Cigarettes  . Smokeless tobacco: Never Used  Substance and Sexual Activity  . Alcohol use: No  . Drug use: Never  . Sexual activity: Not on file  Lifestyle  . Physical activity:    Days per week: Not on file    Minutes per session: Not on file  . Stress: Not on  file  Relationships  . Social connections:    Talks on phone: Not on file    Gets together: Not on file    Attends religious service: Not on file    Active member of club or organization: Not on file    Attends meetings of clubs or organizations: Not on file    Relationship status: Not on file  Other Topics Concern  . Not on file  Social History Narrative   Married.   4 children.   Works as a Futures trader.   Family History  Problem Relation Age of Onset  . Lung cancer Mother   . Hypertension Father   . Hyperlipidemia Father    Allergies  Allergen Reactions  . Other     Pseudocholinesterase deficiency - increased sensitivity to certain muscle relaxant drugs used during general anesthesia   Prior to Admission medications   Not on File     Positive ROS: All other systems have been reviewed and were otherwise negative with the exception of those mentioned in the HPI and as above.  Physical Exam: General: Alert, no acute distress Cardiovascular: Regular rate and rhythm, no murmurs rubs or gallops.  No pedal edema Respiratory: Clear to auscultation bilaterally, no wheezes rales or rhonchi. No cyanosis, no use of accessory musculature GI: No organomegaly, abdomen  is soft and non-tender nondistended with positive bowel sounds. Skin: Skin intact, no lesions within the operative field. Neurologic: Sensation intact distally Psychiatric: Patient is competent for consent with normal mood and affect Lymphatic: No axillary or cervical lymphadenopathy  MUSCULOSKELETAL: RIGHT KNEE: Skin intact, patient has a large prepatellar bursa.  There is no associated erythema or ecchymosis.  Patient has painless knee range of motion.  Flexion is limited to 120 degrees.  This is mildly tender to palpation.  Assessment: PREPATELLAR BURSITIS OF RIGHT KNEE  Plan: Plan for Procedure(s): RIGHT KNEE BURSECTOMY  Viewed the details of the procedure and the postoperative course with the patient.  I  discussed the risks and benefits of surgery.  He understands the risks include but are not limited to infection, bleeding , nerve or blood vessel injury, joint stiffness or loss of motion, knee stiffness, persistent pain, weakness or instability, and the need for further surgery. Medical risks include but are not limited to DVT and pulmonary embolism, myocardial infarction, stroke, pneumonia, respiratory failure and death. Patient understood these risks and wished to proceed.   Juanell Fairly, MD   03/05/2018 12:38 PM

## 2018-03-06 ENCOUNTER — Encounter: Payer: Self-pay | Admitting: Orthopedic Surgery

## 2018-03-07 LAB — SURGICAL PATHOLOGY

## 2018-03-13 NOTE — Anesthesia Postprocedure Evaluation (Signed)
Anesthesia Post Note  Patient: Crystal Reynolds  Procedure(s) Performed: KNEE BURSECTOMY (Right Knee)  Patient location during evaluation: PACU Anesthesia Type: General Level of consciousness: awake and alert and oriented Pain management: pain level controlled Vital Signs Assessment: post-procedure vital signs reviewed and stable Respiratory status: spontaneous breathing Cardiovascular status: blood pressure returned to baseline Anesthetic complications: no     Last Vitals:  Vitals:   03/05/18 1429 03/05/18 1455  BP: 124/76 124/75  Pulse: (!) 56 (!) 52  Resp: 16 16  Temp: 36.6 C   SpO2: 100% 98%    Last Pain:  Vitals:   03/05/18 1507  TempSrc:   PainSc: 3                  Mikhayla Phillis
